# Patient Record
Sex: Female | Born: 1990 | Race: Black or African American | Hispanic: No | Marital: Single | Smoking: Never smoker
Health system: Southern US, Community
[De-identification: ages and names within clinical notes are randomized; demographics above are authoritative.]

## PROBLEM LIST (undated history)

## (undated) ENCOUNTER — Inpatient Hospital Stay (HOSPITAL_COMMUNITY): Payer: Self-pay

## (undated) DIAGNOSIS — R202 Paresthesia of skin: Secondary | ICD-10-CM

## (undated) DIAGNOSIS — E669 Obesity, unspecified: Secondary | ICD-10-CM

## (undated) DIAGNOSIS — L309 Dermatitis, unspecified: Secondary | ICD-10-CM

## (undated) DIAGNOSIS — Z0000 Encounter for general adult medical examination without abnormal findings: Secondary | ICD-10-CM

## (undated) DIAGNOSIS — T7840XA Allergy, unspecified, initial encounter: Secondary | ICD-10-CM

## (undated) DIAGNOSIS — R87612 Low grade squamous intraepithelial lesion on cytologic smear of cervix (LGSIL): Secondary | ICD-10-CM

## (undated) DIAGNOSIS — Z789 Other specified health status: Secondary | ICD-10-CM

## (undated) HISTORY — DX: Paresthesia of skin: R20.2

## (undated) HISTORY — DX: Allergy, unspecified, initial encounter: T78.40XA

## (undated) HISTORY — DX: Low grade squamous intraepithelial lesion on cytologic smear of cervix (LGSIL): R87.612

## (undated) HISTORY — DX: Obesity, unspecified: E66.9

## (undated) HISTORY — DX: Other specified health status: Z78.9

## (undated) HISTORY — DX: Encounter for general adult medical examination without abnormal findings: Z00.00

## (undated) HISTORY — DX: Dermatitis, unspecified: L30.9

---

## 2010-03-16 HISTORY — PX: COLPOSCOPY: SHX161

## 2010-08-31 ENCOUNTER — Inpatient Hospital Stay (INDEPENDENT_AMBULATORY_CARE_PROVIDER_SITE_OTHER)
Admission: RE | Admit: 2010-08-31 | Discharge: 2010-08-31 | Disposition: A | Discharge status: Home / Self Care | Payer: PRIVATE HEALTH INSURANCE | Source: Ambulatory Visit | Attending: Family Medicine | Admitting: Family Medicine

## 2010-08-31 DIAGNOSIS — J039 Acute tonsillitis, unspecified: Secondary | ICD-10-CM

## 2012-01-11 ENCOUNTER — Emergency Department (INDEPENDENT_AMBULATORY_CARE_PROVIDER_SITE_OTHER)
Admission: EM | Admit: 2012-01-11 | Discharge: 2012-01-11 | Disposition: A | Discharge status: Home / Self Care | Payer: BC Managed Care – PPO | Source: Home / Self Care | Attending: Family Medicine | Admitting: Family Medicine

## 2012-01-11 ENCOUNTER — Encounter (HOSPITAL_COMMUNITY): Payer: Self-pay

## 2012-01-11 DIAGNOSIS — S139XXA Sprain of joints and ligaments of unspecified parts of neck, initial encounter: Secondary | ICD-10-CM

## 2012-01-11 DIAGNOSIS — S161XXA Strain of muscle, fascia and tendon at neck level, initial encounter: Secondary | ICD-10-CM

## 2012-01-11 MED ORDER — CYCLOBENZAPRINE HCL 5 MG PO TABS: 5.0000 mg | ORAL_TABLET | Freq: Three times a day (TID) | ORAL | Status: DC | PRN

## 2012-01-11 NOTE — ED Notes (Signed)
Patient states she was getting out of bed this morning and heard a "pop" she started having neck, shoulder and upper back pain on right side

## 2012-01-11 NOTE — ED Provider Notes (Signed)
History     CSN: [id number]  Arrival date & time 01/11/12  1111   First MD Initiated Contact with Patient 01/11/12 1258      Chief Complaint  Patient presents with  . Neck Pain    (Consider location/radiation/quality/duration/timing/severity/associated sxs/prior treatment) Patient is a 21 y.o. female presenting with neck pain. The history is provided by the patient.  Neck Pain  This is a new problem. The current episode started 3 to 5 hours ago. The problem has not changed since onset.The pain is associated with nothing (getting out of bed felt pop). There has been no fever. The quality of the pain is described as shooting. The pain radiates to the right scapula. The pain is mild. The symptoms are aggravated by position. Pertinent negatives include no numbness, no tingling and no weakness. She has tried nothing for the symptoms.    History reviewed. No pertinent past medical history.  History reviewed. No pertinent past surgical history.  No family history on file.  History  Substance Use Topics  . Smoking status: Never Smoker   . Smokeless tobacco: Not on file  . Alcohol Use: Yes    OB History    Grav Para Term Preterm Abortions TAB SAB Ect Mult Living                  Review of Systems  Constitutional: Negative.   HENT: Positive for neck pain. Negative for neck stiffness.   Musculoskeletal: Negative for back pain.  Neurological: Negative.  Negative for tingling, weakness and numbness.    Allergies  Review of patient's allergies indicates no known allergies.  Home Medications   Current Outpatient Rx  Name Route Sig Dispense Refill  . CYCLOBENZAPRINE HCL 5 MG PO TABS Oral Take 1 tablet (5 mg total) by mouth 3 (three) times daily as needed for muscle spasms. 30 tablet 0    BP 127/80  Pulse 68  Temp 98.2 F (36.8 C) (Oral)  Resp 18  SpO2 99%  Physical Exam  Nursing note and vitals reviewed. Constitutional: She is oriented to person, place, and time. She  appears well-developed and well-nourished.  HENT:  Head: Normocephalic.  Neck: Neck supple. Muscular tenderness present. Decreased range of motion present.    Musculoskeletal: She exhibits tenderness.  Lymphadenopathy:    She has no cervical adenopathy.  Neurological: She is alert and oriented to person, place, and time.  Skin: Skin is warm and dry.  Psychiatric: She has a normal mood and affect.    ED Course  Procedures (including critical care time)  Labs Reviewed - No data to display No results found.   1. Strain of neck muscle       MDM          Linna Hoff, MD 01/12/12 [phone]

## 2012-12-05 ENCOUNTER — Emergency Department (INDEPENDENT_AMBULATORY_CARE_PROVIDER_SITE_OTHER)
Admission: EM | Admit: 2012-12-05 | Discharge: 2012-12-05 | Disposition: A | Discharge status: Home / Self Care | Payer: Self-pay | Source: Home / Self Care | Attending: Family Medicine | Admitting: Family Medicine

## 2012-12-05 ENCOUNTER — Encounter (HOSPITAL_COMMUNITY): Payer: Self-pay | Admitting: Emergency Medicine

## 2012-12-05 DIAGNOSIS — J309 Allergic rhinitis, unspecified: Secondary | ICD-10-CM

## 2012-12-05 DIAGNOSIS — J302 Other seasonal allergic rhinitis: Secondary | ICD-10-CM

## 2012-12-05 MED ORDER — FLUTICASONE PROPIONATE 50 MCG/ACT NA SUSP: 2.0000 | Freq: Every day | NASAL | Status: DC

## 2012-12-05 NOTE — ED Notes (Signed)
C/o facial swelling but not sure the cause.  Face does itch.  Eyes are irritated.

## 2012-12-05 NOTE — ED Provider Notes (Signed)
Krista Merritt is a 22 y.o. female who presents to Urgent Care today for left eye puffiness. Patient notes some puffiness and swelling surrounding her left eye as well as itching. She notes some runny nose as well. She denies any eye pain blurry vision fevers chills radiating pain weakness or numbness. She feels well otherwise.    History reviewed. No pertinent past medical history. History  Substance Use Topics  . Smoking status: Never Smoker   . Smokeless tobacco: Not on file  . Alcohol Use: Yes   ROS as above Medications reviewed. No current facility-administered medications for this encounter.   Current Outpatient Prescriptions  Medication Sig Dispense Refill  . fluticasone (FLONASE) 50 MCG/ACT nasal spray Place 2 sprays into the nose daily.  16 g  2    Exam:  BP 134/77  Pulse 92  Temp(Src) 98.4 F (36.9 C) (Oral)  Resp 20  SpO2 100% Gen: Well NAD HEENT: EOMI,  MMM, mild swelling of the upper and lower eyelid on the left. Nontender no significant conjunctival injection.  Lungs: CTABL Nl WOB Heart: RRR no MRG   No results found for this or any previous visit (from the past 24 hour(s)). No results found.  Assessment and Plan: 22 y.o. female with allergies. Doubtful for cellulitis is nontender.  Plan to treat with Flonase nasal spray, Zaditor eyedrops, cetirizine Followup as needed Discussed warning signs or symptoms. Please see discharge instructions. Patient expresses understanding.      Rodolph Bong, MD 12/05/12 [phone]

## 2013-06-13 ENCOUNTER — Encounter: Payer: Self-pay | Admitting: Advanced Practice Midwife

## 2013-06-13 ENCOUNTER — Ambulatory Visit (INDEPENDENT_AMBULATORY_CARE_PROVIDER_SITE_OTHER): Payer: Self-pay | Admitting: Advanced Practice Midwife

## 2013-06-13 VITALS — BP 123/76 | HR 85 | Temp 97.8°F | Ht 66.0 in | Wt 253.0 lb

## 2013-06-13 DIAGNOSIS — Z3046 Encounter for surveillance of implantable subdermal contraceptive: Secondary | ICD-10-CM

## 2013-06-13 DIAGNOSIS — B9689 Other specified bacterial agents as the cause of diseases classified elsewhere: Secondary | ICD-10-CM

## 2013-06-13 DIAGNOSIS — L293 Anogenital pruritus, unspecified: Secondary | ICD-10-CM

## 2013-06-13 DIAGNOSIS — N898 Other specified noninflammatory disorders of vagina: Secondary | ICD-10-CM

## 2013-06-13 DIAGNOSIS — A499 Bacterial infection, unspecified: Secondary | ICD-10-CM

## 2013-06-13 DIAGNOSIS — N760 Acute vaginitis: Secondary | ICD-10-CM

## 2013-06-13 MED ORDER — METRONIDAZOLE 500 MG PO TABS: 500.0000 mg | ORAL_TABLET | Freq: Two times a day (BID) | ORAL | Status: DC

## 2013-06-13 NOTE — Progress Notes (Signed)
Procedure Note Removal of Implanon  Patient had Implanon inserted in 05/2013. Desires removal today.   Reviewed risk and benefits of procedure. Alternative options discussed Patient reported understanding and agreed to continue.   The patient's left arm was palpated and the implant device located. The area was prepped with Betadinex3. The distal end of the device was palpated and 1 cc of 1% lidocaine without epinephrine was injected. A 1.5 mm incision was made. Any fibrotic tissue was carefully dissected away using blunt and/or sharp dissection. The device was removed in an intact manner. Steri-strips and a sterile dressing were applied to the incision.   Followup: The patient tolerated the procedure well without complications. Standard post-procedure care is explained and return precautions are given.  Patient plans no method, Preconception counseling completed.  Krista Merritt CNM     Subjective:    Krista Merritt is a 23 y.o. female who presents for Nexplanon removal and desires screen for BV. Reports abnormal discharge w/ itching. Reports history of BV before and she has similar response.   Patient reports she desires pregnancy, patient does not have a partner.   Contraception: none Menstrual History: OB History   Grav Para Term Preterm Abortions TAB SAB Ect Mult Living   0 0 0 0 0 0 0 0 0 0       Menarche age: 89  No LMP recorded. Patient has had an implant.    The following portions of the patient's history were reviewed and updated as appropriate: allergies, current medications, past family history, past medical history, past social history, past surgical history and problem list.  Review of Systems Pertinent items are noted in HPI.    Objective:    BP 123/76  Pulse 85  Temp(Src) 97.8 F (36.6 C)  Ht 5\' 6"  (1.676 m)  Wt 253 lb (114.76 kg)  BMI 40.85 kg/m2 General:   alert and cooperative  Lymph Nodes:   Cervical, supraclavicular, and axillary nodes normal.   Pelvis:  External genitalia: normal general appearance Vaginal: normal mucosa without prolapse or lesions and discharge, white Copious white discharge, + whiff  Cultures:  bacterial culture     Assessment:     Bacterial Vaginosis Preconception Counseling    Implanon removal   Plan:     Flagyl PO, education completed on BV and prevention Encouraged patient be on daily PNV Implanon removed, see note.  30 min spent with patient greater than 80% spent in counseling and coordination of care.      Lyndle Pang Wilson Merritt CNM

## 2013-06-14 LAB — WET PREP BY MOLECULAR PROBE
Candida species: NEGATIVE
GARDNERELLA VAGINALIS: POSITIVE — AB
TRICHOMONAS VAG: NEGATIVE

## 2013-06-20 ENCOUNTER — Ambulatory Visit: Payer: Self-pay | Admitting: Advanced Practice Midwife

## 2013-06-30 ENCOUNTER — Ambulatory Visit: Payer: Self-pay | Admitting: Advanced Practice Midwife

## 2013-12-20 ENCOUNTER — Encounter (HOSPITAL_COMMUNITY): Payer: Self-pay | Admitting: Emergency Medicine

## 2013-12-20 ENCOUNTER — Emergency Department (INDEPENDENT_AMBULATORY_CARE_PROVIDER_SITE_OTHER)
Admission: EM | Admit: 2013-12-20 | Discharge: 2013-12-20 | Disposition: A | Discharge status: Home / Self Care | Payer: Self-pay | Source: Home / Self Care | Attending: Family Medicine | Admitting: Family Medicine

## 2013-12-20 DIAGNOSIS — L03031 Cellulitis of right toe: Secondary | ICD-10-CM

## 2013-12-20 DIAGNOSIS — B353 Tinea pedis: Secondary | ICD-10-CM

## 2013-12-20 MED ORDER — CEPHALEXIN 500 MG PO CAPS: 500.0000 mg | ORAL_CAPSULE | Freq: Four times a day (QID) | ORAL | Status: DC

## 2013-12-20 MED ORDER — SULFAMETHOXAZOLE-TRIMETHOPRIM 800-160 MG PO TABS: 2.0000 | ORAL_TABLET | Freq: Two times a day (BID) | ORAL | Status: DC

## 2013-12-20 NOTE — Discharge Instructions (Signed)
Thank you for coming in today. Use over-the-counter Lamisil cream on your toes.  Take both antibiotics as directed for one week Go to the emergency room if you get worse   Athlete's Foot Athlete's foot (tinea pedis) is a fungal infection of the skin on the feet. It often occurs on the skin between the toes or underneath the toes. It can also occur on the soles of the feet. Athlete's foot is more likely to occur in hot, humid weather. Not washing your feet or changing your socks often enough can contribute to athlete's foot. The infection can spread from person to person (contagious). CAUSES Athlete's foot is caused by a fungus. This fungus thrives in warm, moist places. Most people get athlete's foot by sharing shower stalls, towels, and wet floors with an infected person. People with weakened immune systems, including those with diabetes, may be more likely to get athlete's foot. SYMPTOMS   Itchy areas between the toes or on the soles of the feet.  White, flaky, or scaly areas between the toes or on the soles of the feet.  Tiny, intensely itchy blisters between the toes or on the soles of the feet.  Tiny cuts on the skin. These cuts can develop a bacterial infection.  Thick or discolored toenails. DIAGNOSIS  Your caregiver can usually tell what the problem is by doing a physical exam. Your caregiver may also take a skin sample from the rash area. The skin sample may be examined under a microscope, or it may be tested to see if fungus will grow in the sample. A sample may also be taken from your toenail for testing. TREATMENT  Over-the-counter and prescription medicines can be used to kill the fungus. These medicines are available as powders or creams. Your caregiver can suggest medicines for you. Fungal infections respond slowly to treatment. You may need to continue using your medicine for several weeks. PREVENTION   Do not share towels.  Wear sandals in wet areas, such as shared  locker rooms and shared showers.  Keep your feet dry. Wear shoes that allow air to circulate. Wear cotton or wool socks. HOME CARE INSTRUCTIONS   Take medicines as directed by your caregiver. Do not use steroid creams on athlete's foot.  Keep your feet clean and cool. Wash your feet daily and dry them thoroughly, especially between your toes.  Change your socks every day. Wear cotton or wool socks. In hot climates, you may need to change your socks 2 to 3 times per day.  Wear sandals or canvas tennis shoes with good air circulation.  If you have blisters, soak your feet in Burow's solution or Epsom salts for 20 to 30 minutes, 2 times a day to dry out the blisters. Make sure you dry your feet thoroughly afterward. SEEK MEDICAL CARE IF:   You have a fever.  You have swelling, soreness, warmth, or redness in your foot.  You are not getting better after 7 days of treatment.  You are not completely cured after 30 days.  You have any problems caused by your medicines. MAKE SURE YOU:   Understand these instructions.  Will watch your condition.  Will get help right away if you are not doing well or get worse. Document Released: 02/28/2000 Document Revised: 05/25/2011 Document Reviewed: 12/19/2010 [facility] Patient Information 2015 [employer] [place]. This information is not intended to replace advice given to you by your health care provider. Make sure you discuss any questions you have with your health care  provider.  Cellulitis Cellulitis is an infection of the skin and the tissue beneath it. The infected area is usually red and tender. Cellulitis occurs most often in the arms and lower legs.  CAUSES  Cellulitis is caused by bacteria that enter the skin through cracks or cuts in the skin. The most common types of bacteria that cause cellulitis are staphylococci and streptococci. SIGNS AND SYMPTOMS   Redness and warmth.  Swelling.  Tenderness or pain.  Fever. DIAGNOSIS  Your  health care provider can usually determine what is wrong based on a physical exam. Blood tests may also be done. TREATMENT  Treatment usually involves taking an antibiotic medicine. HOME CARE INSTRUCTIONS   Take your antibiotic medicine as directed by your health care provider. Finish the antibiotic even if you start to feel better.  Keep the infected arm or leg elevated to reduce swelling.  Apply a warm cloth to the affected area up to 4 times per day to relieve pain.  Take medicines only as directed by your health care provider.  Keep all follow-up visits as directed by your health care provider. SEEK MEDICAL CARE IF:   You notice red streaks coming from the infected area.  Your red area gets larger or turns dark in color.  Your bone or joint underneath the infected area becomes painful after the skin has healed.  Your infection returns in the same area or another area.  You notice a swollen bump in the infected area.  You develop new symptoms.  You have a fever. SEEK IMMEDIATE MEDICAL CARE IF:   You feel very sleepy.  You develop vomiting or diarrhea.  You have a general ill feeling (malaise) with muscle aches and pains. MAKE SURE YOU:   Understand these instructions.  Will watch your condition.  Will get help right away if you are not doing well or get worse. Document Released: 12/10/2004 Document Revised: 07/17/2013 Document Reviewed: 05/18/2011 [facility] Patient Information 2015 [place], [place]. This information is not intended to replace advice given to you by your health care provider. Make sure you discuss any questions you have with your health care provider.

## 2013-12-20 NOTE — ED Notes (Signed)
C/o pain on 2nd digit on right toe Sx include: swelling and pain w/activity Denies inj/trauma Alert, no signs of acute distress.

## 2013-12-20 NOTE — ED Provider Notes (Signed)
Whitnee E StribChancy Milroylin is a 23 y.o. female who presents to Urgent Care today for right second toe pain and swelling. Symptoms started yesterday. Patient denies any injury. No fevers or chills nausea vomiting or diarrhea. Symptoms are mild to moderate.   History reviewed. No pertinent past medical history. History  Substance Use Topics  . Smoking status: Never Smoker   . Smokeless tobacco: Not on file  . Alcohol Use: Yes     Comment: Ocassionally   ROS as above Medications: No current facility-administered medications for this encounter.   Current Outpatient Prescriptions  Medication Sig Dispense Refill  . cephALEXin (KEFLEX) 500 MG capsule Take 1 capsule (500 mg total) by mouth 4 (four) times daily.  28 capsule  0  . metroNIDAZOLE (FLAGYL) 500 MG tablet Take 1 tablet (500 mg total) by mouth 2 (two) times daily.  14 tablet  0  . sulfamethoxazole-trimethoprim (SEPTRA DS) 800-160 MG per tablet Take 2 tablets by mouth 2 (two) times daily.  28 tablet  0    Exam:  BP 130/83  Pulse 79  Temp(Src) 98.2 F (36.8 C) (Oral)  Resp 16  SpO2 100% Gen: Well NAD HEENT: EOMI,  MMM Lungs: Normal work of breathing. CTABL Heart: RRR no MRG Abd: NABS, Soft. Nondistended, Nontender Exts: Brisk capillary refill, warm and well perfused.  Right foot: Second toe mildly swollen mildly tender erythematous. Athlete's foot present interdigital web spaces second and third toe  No results found for this or any previous visit (from the past 24 hour(s)). No results found.  Assessment and Plan: 23 y.o. female with cellulitis of the right second toe due to athlete's foot. Treatment with Keflex and Bactrim. Additionally use topical terbinafine.  Discussed warning signs or symptoms. Please see discharge instructions. Patient expresses understanding.     Rodolph BongEvan S Dyllen Menning, MD 12/20/13 2122

## 2013-12-28 ENCOUNTER — Emergency Department (HOSPITAL_COMMUNITY)
Admission: EM | Admit: 2013-12-28 | Discharge: 2013-12-29 | Disposition: A | Discharge status: Home / Self Care | Payer: Self-pay | Attending: Emergency Medicine | Admitting: Emergency Medicine

## 2013-12-28 ENCOUNTER — Encounter (HOSPITAL_COMMUNITY): Payer: Self-pay | Admitting: Emergency Medicine

## 2013-12-28 DIAGNOSIS — Z792 Long term (current) use of antibiotics: Secondary | ICD-10-CM | POA: Insufficient documentation

## 2013-12-28 DIAGNOSIS — R21 Rash and other nonspecific skin eruption: Secondary | ICD-10-CM | POA: Insufficient documentation

## 2013-12-28 NOTE — ED Notes (Signed)
Pt reports developing small itching "bumps" to entire body x 4 days. Denies pain. Denies fever/chills. Denies trying any medications to relieve. Pt in NAD.

## 2013-12-28 NOTE — ED Provider Notes (Signed)
CSN: [id number]     Arrival date & time 12/28/13  2255 History  This chart was scribed for a non-physician practitioner, Krista GladHeather Maurie Musco, PA-C working with Krista GivensIva L Knapp, MD by Krista Merritt, ED Scribe. [facility] The patient's care was started at 11:54 PM.     Chief Complaint  Patient presents with  . Rash      Patient is a 23 y.o. female presenting with rash. The history is provided by the patient. No language interpreter was used.  Rash Location:  Full body Associated symptoms: no fever   HPI Comments: Krista Merritt is a 23 y.o. female who presents to the Emergency Department complaining of generalized body rash onset 4 days ago. Rash located on the chest, abdomen, back, arms, and legs.  Rash does itch.  Pt has tried putting moisturizer on affected areas without much relief. She denies swelling of lips, tongue, or closing of throat. She further denies any pain to affected areas, fever, or chills. No history of similar occurrences.  No known contacts with similar rash.  No new soaps, detergents, lotions, or medications.     History reviewed. No pertinent past medical history. Past Surgical History  Procedure Laterality Date  . Colposcopy     No family history on file. History  Substance Use Topics  . Smoking status: Never Smoker   . Smokeless tobacco: Not on file  . Alcohol Use: Yes     Comment: Ocassionally   OB History   Grav Para Term Preterm Abortions TAB SAB Ect Mult Living   0 0 0 0 0 0 0 0 0 0      Review of Systems  Constitutional: Negative for fever and chills.  Skin: Positive for rash.      Allergies  Review of patient's allergies indicates no known allergies.  Home Medications   Prior to Admission medications   Medication Sig Start Date End Date Taking? Authorizing Provider  cephALEXin (KEFLEX) 500 MG capsule Take 1 capsule (500 mg total) by mouth 4 (four) times daily. 12/20/13   Krista BongEvan S Corey, MD  metroNIDAZOLE (FLAGYL) 500  MG tablet Take 1 tablet (500 mg total) by mouth 2 (two) times daily. 06/13/13   Krista Merritt, CNM  sulfamethoxazole-trimethoprim (SEPTRA DS) 800-160 MG per tablet Take 2 tablets by mouth 2 (two) times daily. 12/20/13   Krista BongEvan S Corey, MD   BP 135/75  Pulse 102  Temp(Src) 98.5 F (36.9 C) (Oral)  Resp 20  Ht 5\' 5"  (1.651 m)  Wt 230 lb (104.327 kg)  BMI 38.27 kg/m2  SpO2 100%  LMP 12/03/2013 Physical Exam  Nursing note and vitals reviewed. Constitutional: She is oriented to person, place, and time. She appears well-developed and well-nourished. No distress.  HENT:  Head: Normocephalic and atraumatic.  Mouth/Throat: Oropharynx is clear and moist.  Eyes: Conjunctivae and EOM are normal.  Neck: Neck supple. No tracheal deviation present.  Cardiovascular: Normal rate, regular rhythm and normal heart sounds.   Pulmonary/Chest: Effort normal and breath sounds normal. No respiratory distress.  Musculoskeletal: Normal range of motion.  Neurological: She is alert and oriented to person, place, and time.  Skin: Skin is warm and dry. There is erythema.  Diffuse erythematous papular rash on abdomen, chest, back, inner thighs, inner upper arms bilaterally, and anterior neck. No rash in web spaces of hand.   Psychiatric: She has a normal mood and affect. Her behavior is normal.    ED Course  Procedures (including  critical care time) Labs Review Labs Reviewed - No data to display  Results for orders placed in visit on 06/13/13  WET PREP BY MOLECULAR PROBE      Result Value Ref Range   Candida species NEG  Negative   Trichomonas vaginosis NEG  Negative   Gardnerella vaginalis POS (*) Negative   No results found.   Imaging Review No results found.   EKG Interpretation None     Medications - No data to display  11:59 PM- Treatment plan was discussed with patient who verbalizes understanding and agrees.   MDM   Final diagnoses:  None   Patient presents today with a rash.  Rash  not in the distribution of Scabies or Syphilis.  No swelling of the lips, tongue, or throat.  Lungs CTAB.  Feel that the patient is stable for discharge.  Return precautions given.  I personally performed the services described in this documentation, which was scribed in my presence. The recorded information has been reviewed and is accurate.   Krista GladHeather Esa Raden, PA-C 12/29/13 [phone]

## 2013-12-29 MED ORDER — HYDROCORTISONE 2.5 % EX LOTN: TOPICAL_LOTION | Freq: Two times a day (BID) | CUTANEOUS | Status: DC

## 2014-01-05 NOTE — ED Provider Notes (Signed)
Medical screening examination/treatment/procedure(s) were performed by non-physician practitioner and as supervising physician I was immediately available for consultation/collaboration.   EKG Interpretation None      Dene Landsberg, MD, FACEP   Senta Kantor L Margaree Sandhu, MD 01/05/14 1449 

## 2014-10-19 ENCOUNTER — Encounter: Payer: Self-pay | Admitting: *Deleted

## 2014-10-19 ENCOUNTER — Ambulatory Visit: Payer: Self-pay | Admitting: Obstetrics

## 2014-10-19 ENCOUNTER — Telehealth: Payer: Self-pay | Admitting: *Deleted

## 2014-10-19 NOTE — Telephone Encounter (Signed)
Pre-Visit Call completed with patient and chart updated.   Pre-Visit Info documented in Specialty Comments under [employer].    

## 2014-10-22 ENCOUNTER — Ambulatory Visit (INDEPENDENT_AMBULATORY_CARE_PROVIDER_SITE_OTHER): Payer: PRIVATE HEALTH INSURANCE | Admitting: Family Medicine

## 2014-10-22 ENCOUNTER — Encounter: Payer: Self-pay | Admitting: Family Medicine

## 2014-10-22 ENCOUNTER — Encounter: Payer: Self-pay | Admitting: Obstetrics

## 2014-10-22 ENCOUNTER — Ambulatory Visit (INDEPENDENT_AMBULATORY_CARE_PROVIDER_SITE_OTHER): Payer: PRIVATE HEALTH INSURANCE | Admitting: Obstetrics

## 2014-10-22 VITALS — BP 120/82 | HR 94 | Temp 98.2°F | Ht 65.0 in | Wt 257.1 lb

## 2014-10-22 VITALS — BP 110/74 | HR 87 | Temp 97.8°F | Ht 65.0 in | Wt 255.0 lb

## 2014-10-22 DIAGNOSIS — N760 Acute vaginitis: Secondary | ICD-10-CM

## 2014-10-22 DIAGNOSIS — Z01419 Encounter for gynecological examination (general) (routine) without abnormal findings: Secondary | ICD-10-CM

## 2014-10-22 DIAGNOSIS — B9689 Other specified bacterial agents as the cause of diseases classified elsewhere: Secondary | ICD-10-CM

## 2014-10-22 DIAGNOSIS — Z0000 Encounter for general adult medical examination without abnormal findings: Secondary | ICD-10-CM | POA: Diagnosis not present

## 2014-10-22 DIAGNOSIS — A499 Bacterial infection, unspecified: Secondary | ICD-10-CM | POA: Diagnosis not present

## 2014-10-22 DIAGNOSIS — L309 Dermatitis, unspecified: Secondary | ICD-10-CM

## 2014-10-22 DIAGNOSIS — E669 Obesity, unspecified: Secondary | ICD-10-CM

## 2014-10-22 DIAGNOSIS — Z113 Encounter for screening for infections with a predominantly sexual mode of transmission: Secondary | ICD-10-CM | POA: Diagnosis not present

## 2014-10-22 DIAGNOSIS — T7840XA Allergy, unspecified, initial encounter: Secondary | ICD-10-CM

## 2014-10-22 DIAGNOSIS — Z3009 Encounter for other general counseling and advice on contraception: Secondary | ICD-10-CM

## 2014-10-22 DIAGNOSIS — Z124 Encounter for screening for malignant neoplasm of cervix: Secondary | ICD-10-CM | POA: Diagnosis not present

## 2014-10-22 HISTORY — DX: Obesity, unspecified: E66.9

## 2014-10-22 HISTORY — DX: Dermatitis, unspecified: L30.9

## 2014-10-22 LAB — COMPREHENSIVE METABOLIC PANEL
ALK PHOS: 43 U/L (ref 39–117)
ALT: 16 U/L (ref 0–35)
AST: 20 U/L (ref 0–37)
Albumin: 3.9 g/dL (ref 3.5–5.2)
BILIRUBIN TOTAL: 0.4 mg/dL (ref 0.2–1.2)
BUN: 13 mg/dL (ref 6–23)
CHLORIDE: 108 meq/L (ref 96–112)
CO2: 24 meq/L (ref 19–32)
CREATININE: 0.8 mg/dL (ref 0.40–1.20)
Calcium: 9 mg/dL (ref 8.4–10.5)
GFR: 111.96 mL/min (ref >60.00)
Glucose, Bld: 118 mg/dL — ABNORMAL HIGH (ref 70–99)
POTASSIUM: 3.8 meq/L (ref 3.5–5.1)
SODIUM: 140 meq/L (ref 135–145)
Total Protein: 7.2 g/dL (ref 6.0–8.3)

## 2014-10-22 LAB — LIPID PANEL
Cholesterol: 156 mg/dL (ref 0–200)
HDL: 41.1 mg/dL (ref >39.00)
LDL CALC: 94 mg/dL (ref 0–99)
NONHDL: 115.18
TRIGLYCERIDES: 106 mg/dL (ref 0.0–149.0)
Total CHOL/HDL Ratio: 4
VLDL: 21.2 mg/dL (ref 0.0–40.0)

## 2014-10-22 LAB — CBC
HCT: 41.9 % (ref 36.0–46.0)
HEMOGLOBIN: 13.3 g/dL (ref 12.0–15.0)
MCHC: 31.7 g/dL (ref 30.0–36.0)
MCV: 70.4 fl — ABNORMAL LOW (ref 78.0–100.0)
PLATELETS: 344 10*3/uL (ref 150.0–400.0)
RBC: 5.96 Mil/uL — AB (ref 3.87–5.11)
RDW: 15.3 % (ref 11.5–15.5)
WBC: 7 10*3/uL (ref 4.0–10.5)

## 2014-10-22 LAB — RPR

## 2014-10-22 LAB — TSH: TSH: 0.97 u[IU]/mL (ref 0.35–4.50)

## 2014-10-22 MED ORDER — TRIAMCINOLONE ACETONIDE 0.1 % EX CREA: 1.0000 "application " | TOPICAL_CREAM | Freq: Two times a day (BID) | CUTANEOUS | Status: DC | PRN

## 2014-10-22 MED ORDER — METRONIDAZOLE 500 MG PO TABS
500.0000 mg | ORAL_TABLET | Freq: Two times a day (BID) | ORAL | Status: DC
Start: 2014-10-22 — End: 2014-10-22

## 2014-10-22 NOTE — Progress Notes (Signed)
Subjective:        Krista Merritt is a 24 y.o. female here for a routine exam.  Current complaints: none .    Personal health questionnaire:  Is patient Krista Merritt, have a family history of breast and/or ovarian cancer: no Is there a family history of uterine cancer diagnosed at age < 52, gastrointestinal cancer, urinary tract cancer, family member who is a Personnel officer syndrome-associated carrier: no Is the patient overweight and hypertensive, family history of diabetes, personal history of gestational diabetes, preeclampsia or PCOS: no Is patient over 21, have PCOS,  family history of premature CHD under age 35, diabetes, smoke, have hypertension or peripheral artery disease:  no At any time, has a partner hit, kicked or otherwise hurt or frightened you?: no Over the past 2 weeks, have you felt down, depressed or hopeless?: no Over the past 2 weeks, have you felt little interest or pleasure in doing things?:no   Gynecologic History Patient's last menstrual period was 10/07/2014 (exact date). Contraception: condoms Last Pap: 2015. Results were: normal Last mammogram: n/a. Results were: n/a  Obstetric History OB History  Gravida Para Term Preterm AB SAB TAB Ectopic Multiple Living  0 0 0 0 0 0 0 0 0 0         Past Medical History  Diagnosis Date  . Allergy     seasonal    Past Surgical History  Procedure Laterality Date  . Colposcopy       Current outpatient prescriptions:  .  metroNIDAZOLE (FLAGYL) 500 MG tablet, Take 1 tablet (500 mg total) by mouth 2 (two) times daily., Disp: 14 tablet, Rfl: 2 No Known Allergies  History  Substance Use Topics  . Smoking status: Never Smoker   . Smokeless tobacco: Not on file  . Alcohol Use: Yes     Comment: Ocassionally    History reviewed. No pertinent family history.    Review of Systems  Constitutional: negative for fatigue and weight loss.  Positive for obesity Respiratory: negative for cough and  wheezing Cardiovascular: negative for chest pain, fatigue and palpitations Gastrointestinal: negative for abdominal pain and change in bowel habits Musculoskeletal:negative for myalgias Neurological: negative for gait problems and tremors Behavioral/Psych: negative for abusive relationship, depression Endocrine: negative for temperature intolerance   Genitourinary:negative for abnormal menstrual periods, genital lesions, hot flashes, sexual problems.  Positive for malodorous  vaginal discharge Integument/breast: negative for breast lump, breast tenderness, nipple discharge and skin lesion(s)    Objective:       BP 110/74 mmHg  Pulse 87  Temp(Src) 97.8 F (36.6 C)  Ht 5\' 5"  (1.651 m)  Wt 255 lb (115.667 kg)  BMI 42.43 kg/m2  LMP 10/07/2014 (Exact Date) General:   alert  Skin:   no rash or abnormalities  Lungs:   clear to auscultation bilaterally  Heart:   regular rate and rhythm, S1, S2 normal, no murmur, click, rub or gallop  Breasts:   normal without suspicious masses, skin or nipple changes or axillary nodes  Abdomen:  normal findings: no organomegaly, soft, non-tender and no hernia  Pelvis:  External genitalia: normal general appearance Urinary system: urethral meatus normal and bladder without fullness, nontender Vaginal: normal without tenderness, induration or masses Cervix: normal appearance Adnexa: normal bimanual exam Uterus: anteverted and non-tender, normal size   Lab Review Urine pregnancy test Labs reviewed yes Radiologic studies reviewed no    Assessment:    Healthy female exam.    BV  Contraceptive counseling  Obesity  Plan:   Flagyl Rx   Education reviewed: low fat, low cholesterol diet, safe sex/STD prevention, self breast exams and weight bearing exercise. Contraception: condoms. Follow up in: 1 year.   Meds ordered this encounter  Medications  . metroNIDAZOLE (FLAGYL) 500 MG tablet    Sig: Take 1 tablet (500 mg total) by mouth 2 (two)  times daily.    Dispense:  14 tablet    Refill:  2   Orders Placed This Encounter  Procedures  . HIV antibody  . Hepatitis B surface antigen  . RPR  . Hepatitis C antibody

## 2014-10-22 NOTE — Addendum Note (Signed)
Addended by: Marya Landry D on: 10/22/2014 01:52 PM   Modules accepted: Orders

## 2014-10-22 NOTE — Progress Notes (Signed)
Pre visit review using our clinic review tool, if applicable. No additional management support is needed unless otherwise documented below in the visit note. 

## 2014-10-22 NOTE — Patient Instructions (Signed)
Preventive Care for Adults A healthy lifestyle and preventive care can promote health and wellness. Preventive health guidelines for women include the following key practices.  A routine yearly physical is a good way to check with your health care provider about your health and preventive screening. It is a chance to share any concerns and updates on your health and to receive a thorough exam.  Visit your dentist for a routine exam and preventive care every 6 months. Brush your teeth twice a day and floss once a day. Good oral hygiene prevents tooth decay and gum disease.  The frequency of eye exams is based on your age, health, family medical history, use of contact lenses, and other factors. Follow your health care provider's recommendations for frequency of eye exams.  Eat a healthy diet. Foods like vegetables, fruits, whole grains, low-fat dairy products, and lean protein foods contain the nutrients you need without too many calories. Decrease your intake of foods high in solid fats, added sugars, and salt. Eat the right amount of calories for you.Get information about a proper diet from your health care provider, if necessary.  Regular physical exercise is one of the most important things you can do for your health. Most adults should get at least 150 minutes of moderate-intensity exercise (any activity that increases your heart rate and causes you to sweat) each week. In addition, most adults need muscle-strengthening exercises on 2 or more days a week.  Maintain a healthy weight. The body mass index (BMI) is a screening tool to identify possible weight problems. It provides an estimate of body fat based on height and weight. Your health care provider can find your BMI and can help you achieve or maintain a healthy weight.For adults 20 years and older:  A BMI below 18.5 is considered underweight.  A BMI of 18.5 to 24.9 is normal.  A BMI of 25 to 29.9 is considered overweight.  A BMI of  30 and above is considered obese.  Maintain normal blood lipids and cholesterol levels by exercising and minimizing your intake of saturated fat. Eat a balanced diet with plenty of fruit and vegetables. Blood tests for lipids and cholesterol should begin at age 76 and be repeated every 5 years. If your lipid or cholesterol levels are high, you are over 50, or you are at high risk for heart disease, you may need your cholesterol levels checked more frequently.Ongoing high lipid and cholesterol levels should be treated with medicines if diet and exercise are not working.  If you smoke, find out from your health care provider how to quit. If you do not use tobacco, do not start.  Lung cancer screening is recommended for adults aged 22-80 years who are at high risk for developing lung cancer because of a history of smoking. A yearly low-dose CT scan of the lungs is recommended for people who have at least a 30-pack-year history of smoking and are a current smoker or have quit within the past 15 years. A pack year of smoking is smoking an average of 1 pack of cigarettes a day for 1 year (for example: 1 pack a day for 30 years or 2 packs a day for 15 years). Yearly screening should continue until the smoker has stopped smoking for at least 15 years. Yearly screening should be stopped for people who develop a health problem that would prevent them from having lung cancer treatment.  If you are pregnant, do not drink alcohol. If you are breastfeeding,  be very cautious about drinking alcohol. If you are not pregnant and choose to drink alcohol, do not have more than 1 drink per day. One drink is considered to be 12 ounces (355 mL) of beer, 5 ounces (148 mL) of wine, or 1.5 ounces (44 mL) of liquor.  Avoid use of street drugs. Do not share needles with anyone. Ask for help if you need support or instructions about stopping the use of drugs.  High blood pressure causes heart disease and increases the risk of  stroke. Your blood pressure should be checked at least every 1 to 2 years. Ongoing high blood pressure should be treated with medicines if weight loss and exercise do not work.  If you are 75-52 years old, ask your health care provider if you should take aspirin to prevent strokes.  Diabetes screening involves taking a blood sample to check your fasting blood sugar level. This should be done once every 3 years, after age 15, if you are within normal weight and without risk factors for diabetes. Testing should be considered at a younger age or be carried out more frequently if you are overweight and have at least 1 risk factor for diabetes.  Breast cancer screening is essential preventive care for women. You should practice "breast self-awareness." This means understanding the normal appearance and feel of your breasts and may include breast self-examination. Any changes detected, no matter how small, should be reported to a health care provider. Women in their 58s and 30s should have a clinical breast exam (CBE) by a health care provider as part of a regular health exam every 1 to 3 years. After age 16, women should have a CBE every year. Starting at age 53, women should consider having a mammogram (breast X-ray test) every year. Women who have a family history of breast cancer should talk to their health care provider about genetic screening. Women at a high risk of breast cancer should talk to their health care providers about having an MRI and a mammogram every year.  Breast cancer gene (BRCA)-related cancer risk assessment is recommended for women who have family members with BRCA-related cancers. BRCA-related cancers include breast, ovarian, tubal, and peritoneal cancers. Having family members with these cancers may be associated with an increased risk for harmful changes (mutations) in the breast cancer genes BRCA1 and BRCA2. Results of the assessment will determine the need for genetic counseling and  BRCA1 and BRCA2 testing.  Routine pelvic exams to screen for cancer are no longer recommended for nonpregnant women who are considered low risk for cancer of the pelvic organs (ovaries, uterus, and vagina) and who do not have symptoms. Ask your health care provider if a screening pelvic exam is right for you.  If you have had past treatment for cervical cancer or a condition that could lead to cancer, you need Pap tests and screening for cancer for at least 20 years after your treatment. If Pap tests have been discontinued, your risk factors (such as having a new sexual partner) need to be reassessed to determine if screening should be resumed. Some women have medical problems that increase the chance of getting cervical cancer. In these cases, your health care provider may recommend more frequent screening and Pap tests.  The HPV test is an additional test that may be used for cervical cancer screening. The HPV test looks for the virus that can cause the cell changes on the cervix. The cells collected during the Pap test can be  tested for HPV. The HPV test could be used to screen women aged 30 years and older, and should be used in women of any age who have unclear Pap test results. After the age of 30, women should have HPV testing at the same frequency as a Pap test.  Colorectal cancer can be detected and often prevented. Most routine colorectal cancer screening begins at the age of 50 years and continues through age 75 years. However, your health care provider may recommend screening at an earlier age if you have risk factors for colon cancer. On a yearly basis, your health care provider may provide home test kits to check for hidden blood in the stool. Use of a small camera at the end of a tube, to directly examine the colon (sigmoidoscopy or colonoscopy), can detect the earliest forms of colorectal cancer. Talk to your health care provider about this at age 50, when routine screening begins. Direct  exam of the colon should be repeated every 5-10 years through age 75 years, unless early forms of pre-cancerous polyps or small growths are found.  People who are at an increased risk for hepatitis B should be screened for this virus. You are considered at high risk for hepatitis B if:  You were born in a country where hepatitis B occurs often. Talk with your health care provider about which countries are considered high risk.  Your parents were born in a high-risk country and you have not received a shot to protect against hepatitis B (hepatitis B vaccine).  You have HIV or AIDS.  You use needles to inject street drugs.  You live with, or have sex with, someone who has hepatitis B.  You get hemodialysis treatment.  You take certain medicines for conditions like cancer, organ transplantation, and autoimmune conditions.  Hepatitis C blood testing is recommended for all people born from 1945 through 1965 and any individual with known risks for hepatitis C.  Practice safe sex. Use condoms and avoid high-risk sexual practices to reduce the spread of sexually transmitted infections (STIs). STIs include gonorrhea, chlamydia, syphilis, trichomonas, herpes, HPV, and human immunodeficiency virus (HIV). Herpes, HIV, and HPV are viral illnesses that have no cure. They can result in disability, cancer, and death.  You should be screened for sexually transmitted illnesses (STIs) including gonorrhea and chlamydia if:  You are sexually active and are younger than 24 years.  You are older than 24 years and your health care provider tells you that you are at risk for this type of infection.  Your sexual activity has changed since you were last screened and you are at an increased risk for chlamydia or gonorrhea. Ask your health care provider if you are at risk.  If you are at risk of being infected with HIV, it is recommended that you take a prescription medicine daily to prevent HIV infection. This is  called preexposure prophylaxis (PrEP). You are considered at risk if:  You are a heterosexual woman, are sexually active, and are at increased risk for HIV infection.  You take drugs by injection.  You are sexually active with a partner who has HIV.  Talk with your health care provider about whether you are at high risk of being infected with HIV. If you choose to begin PrEP, you should first be tested for HIV. You should then be tested every 3 months for as long as you are taking PrEP.  Osteoporosis is a disease in which the bones lose minerals and strength   with aging. This can result in serious bone fractures or breaks. The risk of osteoporosis can be identified using a bone density scan. Women ages 65 years and over and women at risk for fractures or osteoporosis should discuss screening with their health care providers. Ask your health care provider whether you should take a calcium supplement or vitamin D to reduce the rate of osteoporosis.  Menopause can be associated with physical symptoms and risks. Hormone replacement therapy is available to decrease symptoms and risks. You should talk to your health care provider about whether hormone replacement therapy is right for you.  Use sunscreen. Apply sunscreen liberally and repeatedly throughout the day. You should seek shade when your shadow is shorter than you. Protect yourself by wearing long sleeves, pants, a wide-brimmed hat, and sunglasses year round, whenever you are outdoors.  Once a month, do a whole body skin exam, using a mirror to look at the skin on your back. Tell your health care provider of new moles, moles that have irregular borders, moles that are larger than a pencil eraser, or moles that have changed in shape or color.  Stay current with required vaccines (immunizations).  Influenza vaccine. All adults should be immunized every year.  Tetanus, diphtheria, and acellular pertussis (Td, Tdap) vaccine. Pregnant women should  receive 1 dose of Tdap vaccine during each pregnancy. The dose should be obtained regardless of the length of time since the last dose. Immunization is preferred during the 27th-36th week of gestation. An adult who has not previously received Tdap or who does not know her vaccine status should receive 1 dose of Tdap. This initial dose should be followed by tetanus and diphtheria toxoids (Td) booster doses every 10 years. Adults with an unknown or incomplete history of completing a 3-dose immunization series with Td-containing vaccines should begin or complete a primary immunization series including a Tdap dose. Adults should receive a Td booster every 10 years.  Varicella vaccine. An adult without evidence of immunity to varicella should receive 2 doses or a second dose if she has previously received 1 dose. Pregnant females who do not have evidence of immunity should receive the first dose after pregnancy. This first dose should be obtained before leaving the health care facility. The second dose should be obtained 4-8 weeks after the first dose.  Human papillomavirus (HPV) vaccine. Females aged 13-26 years who have not received the vaccine previously should obtain the 3-dose series. The vaccine is not recommended for use in pregnant females. However, pregnancy testing is not needed before receiving a dose. If a female is found to be pregnant after receiving a dose, no treatment is needed. In that case, the remaining doses should be delayed until after the pregnancy. Immunization is recommended for any person with an immunocompromised condition through the age of 26 years if she did not get any or all doses earlier. During the 3-dose series, the second dose should be obtained 4-8 weeks after the first dose. The third dose should be obtained 24 weeks after the first dose and 16 weeks after the second dose.  Zoster vaccine. One dose is recommended for adults aged 60 years or older unless certain conditions are  present.  Measles, mumps, and rubella (MMR) vaccine. Adults born before 1957 generally are considered immune to measles and mumps. Adults born in 1957 or later should have 1 or more doses of MMR vaccine unless there is a contraindication to the vaccine or there is laboratory evidence of immunity to   each of the three diseases. A routine second dose of MMR vaccine should be obtained at least 28 days after the first dose for students attending postsecondary schools, health care workers, or international travelers. People who received inactivated measles vaccine or an unknown type of measles vaccine during 1963-1967 should receive 2 doses of MMR vaccine. People who received inactivated mumps vaccine or an unknown type of mumps vaccine before 1979 and are at high risk for mumps infection should consider immunization with 2 doses of MMR vaccine. For females of childbearing age, rubella immunity should be determined. If there is no evidence of immunity, females who are not pregnant should be vaccinated. If there is no evidence of immunity, females who are pregnant should delay immunization until after pregnancy. Unvaccinated health care workers born before 1957 who lack laboratory evidence of measles, mumps, or rubella immunity or laboratory confirmation of disease should consider measles and mumps immunization with 2 doses of MMR vaccine or rubella immunization with 1 dose of MMR vaccine.  Pneumococcal 13-valent conjugate (PCV13) vaccine. When indicated, a person who is uncertain of her immunization history and has no record of immunization should receive the PCV13 vaccine. An adult aged 19 years or older who has certain medical conditions and has not been previously immunized should receive 1 dose of PCV13 vaccine. This PCV13 should be followed with a dose of pneumococcal polysaccharide (PPSV23) vaccine. The PPSV23 vaccine dose should be obtained at least 8 weeks after the dose of PCV13 vaccine. An adult aged 19  years or older who has certain medical conditions and previously received 1 or more doses of PPSV23 vaccine should receive 1 dose of PCV13. The PCV13 vaccine dose should be obtained 1 or more years after the last PPSV23 vaccine dose.  Pneumococcal polysaccharide (PPSV23) vaccine. When PCV13 is also indicated, PCV13 should be obtained first. All adults aged 65 years and older should be immunized. An adult younger than age 65 years who has certain medical conditions should be immunized. Any person who resides in a nursing home or long-term care facility should be immunized. An adult smoker should be immunized. People with an immunocompromised condition and certain other conditions should receive both PCV13 and PPSV23 vaccines. People with human immunodeficiency virus (HIV) infection should be immunized as soon as possible after diagnosis. Immunization during chemotherapy or radiation therapy should be avoided. Routine use of PPSV23 vaccine is not recommended for American Indians, [place] Natives, or people younger than 65 years unless there are medical conditions that require PPSV23 vaccine. When indicated, people who have unknown immunization and have no record of immunization should receive PPSV23 vaccine. One-time revaccination 5 years after the first dose of PPSV23 is recommended for people aged 19-64 years who have chronic kidney failure, nephrotic syndrome, asplenia, or immunocompromised conditions. People who received 1-2 doses of PPSV23 before age 65 years should receive another dose of PPSV23 vaccine at age 65 years or later if at least 5 years have passed since the previous dose. Doses of PPSV23 are not needed for people immunized with PPSV23 at or after age 65 years.  Meningococcal vaccine. Adults with asplenia or persistent complement component deficiencies should receive 2 doses of quadrivalent meningococcal conjugate (MenACWY-D) vaccine. The doses should be obtained at least 2 months apart.  Microbiologists working with certain meningococcal bacteria, military recruits, people at risk during an outbreak, and people who travel to or live in countries with a high rate of meningitis should be immunized. A first-year college student up through age   21 years who is living in a residence hall should receive a dose if she did not receive a dose on or after her 16th birthday. Adults who have certain high-risk conditions should receive one or more doses of vaccine.  Hepatitis A vaccine. Adults who wish to be protected from this disease, have certain high-risk conditions, work with hepatitis A-infected animals, work in hepatitis A research labs, or travel to or work in countries with a high rate of hepatitis A should be immunized. Adults who were previously unvaccinated and who anticipate close contact with an international adoptee during the first 60 days after arrival in the [place] from a country with a high rate of hepatitis A should be immunized.  Hepatitis B vaccine. Adults who wish to be protected from this disease, have certain high-risk conditions, may be exposed to blood or other infectious body fluids, are household contacts or sex partners of hepatitis B positive people, are clients or workers in certain care facilities, or travel to or work in countries with a high rate of hepatitis B should be immunized.  Haemophilus influenzae type b (Hib) vaccine. A previously unvaccinated person with asplenia or sickle cell disease or having a scheduled splenectomy should receive 1 dose of Hib vaccine. Regardless of previous immunization, a recipient of a hematopoietic stem cell transplant should receive a 3-dose series 6-12 months after her successful transplant. Hib vaccine is not recommended for adults with HIV infection. Preventive Services / Frequency Ages 64 to 68 years  Blood pressure check.** / Every 1 to 2 years.  Lipid and cholesterol check.** / Every 5 years beginning at age  22.  Clinical breast exam.** / Every 3 years for women in their 88s and 53s.  BRCA-related cancer risk assessment.** / For women who have family members with a BRCA-related cancer (breast, ovarian, tubal, or peritoneal cancers).  Pap test.** / Every 2 years from ages 90 through 51. Every 3 years starting at age 21 through age 56 or 3 with a history of 3 consecutive normal Pap tests.  HPV screening.** / Every 3 years from ages 24 through ages 1 to 46 with a history of 3 consecutive normal Pap tests.  Hepatitis C blood test.** / For any individual with known risks for hepatitis C.  Skin self-exam. / Monthly.  Influenza vaccine. / Every year.  Tetanus, diphtheria, and acellular pertussis (Tdap, Td) vaccine.** / Consult your health care provider. Pregnant women should receive 1 dose of Tdap vaccine during each pregnancy. 1 dose of Td every 10 years.  Varicella vaccine.** / Consult your health care provider. Pregnant females who do not have evidence of immunity should receive the first dose after pregnancy.  HPV vaccine. / 3 doses over 6 months, if 72 and younger. The vaccine is not recommended for use in pregnant females. However, pregnancy testing is not needed before receiving a dose.  Measles, mumps, rubella (MMR) vaccine.** / You need at least 1 dose of MMR if you were born in 1957 or later. You may also need a 2nd dose. For females of childbearing age, rubella immunity should be determined. If there is no evidence of immunity, females who are not pregnant should be vaccinated. If there is no evidence of immunity, females who are pregnant should delay immunization until after pregnancy.  Pneumococcal 13-valent conjugate (PCV13) vaccine.** / Consult your health care provider.  Pneumococcal polysaccharide (PPSV23) vaccine.** / 1 to 2 doses if you smoke cigarettes or if you have certain conditions.  Meningococcal vaccine.** /  1 dose if you are age 19 to 21 years and a first-year college  student living in a residence hall, or have one of several medical conditions, you need to get vaccinated against meningococcal disease. You may also need additional booster doses.  Hepatitis A vaccine.** / Consult your health care provider.  Hepatitis B vaccine.** / Consult your health care provider.  Haemophilus influenzae type b (Hib) vaccine.** / Consult your health care provider. Ages 40 to 64 years  Blood pressure check.** / Every 1 to 2 years.  Lipid and cholesterol check.** / Every 5 years beginning at age 20 years.  Lung cancer screening. / Every year if you are aged 55-80 years and have a 30-pack-year history of smoking and currently smoke or have quit within the past 15 years. Yearly screening is stopped once you have quit smoking for at least 15 years or develop a health problem that would prevent you from having lung cancer treatment.  Clinical breast exam.** / Every year after age 40 years.  BRCA-related cancer risk assessment.** / For women who have family members with a BRCA-related cancer (breast, ovarian, tubal, or peritoneal cancers).  Mammogram.** / Every year beginning at age 40 years and continuing for as long as you are in good health. Consult with your health care provider.  Pap test.** / Every 3 years starting at age 30 years through age 65 or 70 years with a history of 3 consecutive normal Pap tests.  HPV screening.** / Every 3 years from ages 30 years through ages 65 to 70 years with a history of 3 consecutive normal Pap tests.  Fecal occult blood test (FOBT) of stool. / Every year beginning at age 50 years and continuing until age 75 years. You may not need to do this test if you get a colonoscopy every 10 years.  Flexible sigmoidoscopy or colonoscopy.** / Every 5 years for a flexible sigmoidoscopy or every 10 years for a colonoscopy beginning at age 50 years and continuing until age 75 years.  Hepatitis C blood test.** / For all people born from 1945 through  1965 and any individual with known risks for hepatitis C.  Skin self-exam. / Monthly.  Influenza vaccine. / Every year.  Tetanus, diphtheria, and acellular pertussis (Tdap/Td) vaccine.** / Consult your health care provider. Pregnant women should receive 1 dose of Tdap vaccine during each pregnancy. 1 dose of Td every 10 years.  Varicella vaccine.** / Consult your health care provider. Pregnant females who do not have evidence of immunity should receive the first dose after pregnancy.  Zoster vaccine.** / 1 dose for adults aged 60 years or older.  Measles, mumps, rubella (MMR) vaccine.** / You need at least 1 dose of MMR if you were born in 1957 or later. You may also need a 2nd dose. For females of childbearing age, rubella immunity should be determined. If there is no evidence of immunity, females who are not pregnant should be vaccinated. If there is no evidence of immunity, females who are pregnant should delay immunization until after pregnancy.  Pneumococcal 13-valent conjugate (PCV13) vaccine.** / Consult your health care provider.  Pneumococcal polysaccharide (PPSV23) vaccine.** / 1 to 2 doses if you smoke cigarettes or if you have certain conditions.  Meningococcal vaccine.** / Consult your health care provider.  Hepatitis A vaccine.** / Consult your health care provider.  Hepatitis B vaccine.** / Consult your health care provider.  Haemophilus influenzae type b (Hib) vaccine.** / Consult your health care provider. Ages 65   years and over  Blood pressure check.** / Every 1 to 2 years.  Lipid and cholesterol check.** / Every 5 years beginning at age 58 years.  Lung cancer screening. / Every year if you are aged 8-80 years and have a 30-pack-year history of smoking and currently smoke or have quit within the past 15 years. Yearly screening is stopped once you have quit smoking for at least 15 years or develop a health problem that would prevent you from having lung cancer  treatment.  Clinical breast exam.** / Every year after age 77 years.  BRCA-related cancer risk assessment.** / For women who have family members with a BRCA-related cancer (breast, ovarian, tubal, or peritoneal cancers).  Mammogram.** / Every year beginning at age 40 years and continuing for as long as you are in good health. Consult with your health care provider.  Pap test.** / Every 3 years starting at age 33 years through age 67 or 96 years with 3 consecutive normal Pap tests. Testing can be stopped between 65 and 70 years with 3 consecutive normal Pap tests and no abnormal Pap or HPV tests in the past 10 years.  HPV screening.** / Every 3 years from ages 41 years through ages 6 or 77 years with a history of 3 consecutive normal Pap tests. Testing can be stopped between 65 and 70 years with 3 consecutive normal Pap tests and no abnormal Pap or HPV tests in the past 10 years.  Fecal occult blood test (FOBT) of stool. / Every year beginning at age 43 years and continuing until age 60 years. You may not need to do this test if you get a colonoscopy every 10 years.  Flexible sigmoidoscopy or colonoscopy.** / Every 5 years for a flexible sigmoidoscopy or every 10 years for a colonoscopy beginning at age 34 years and continuing until age 78 years.  Hepatitis C blood test.** / For all people born from [birth year] through 1966 and any individual with known risks for hepatitis C.  Osteoporosis screening.** / A one-time screening for women ages 96 years and over and women at risk for fractures or osteoporosis.  Skin self-exam. / Monthly.  Influenza vaccine. / Every year.  Tetanus, diphtheria, and acellular pertussis (Tdap/Td) vaccine.** / 1 dose of Td every 10 years.  Varicella vaccine.** / Consult your health care provider.  Zoster vaccine.** / 1 dose for adults aged 49 years or older.  Pneumococcal 13-valent conjugate (PCV13) vaccine.** / Consult your health care provider.  Pneumococcal  polysaccharide (PPSV23) vaccine.** / 1 dose for all adults aged 32 years and older.  Meningococcal vaccine.** / Consult your health care provider.  Hepatitis A vaccine.** / Consult your health care provider.  Hepatitis B vaccine.** / Consult your health care provider.  Haemophilus influenzae type b (Hib) vaccine.** / Consult your health care provider. ** Family history and personal history of risk and conditions may change your health care provider's recommendations. Document Released: 04/28/2001 Document Revised: 07/17/2013 Document Reviewed: 07/28/2010 [facility] Patient Information 2015 [place], [place]. This information is not intended to replace advice given to you by your health care provider. Make sure you discuss any questions you have with your health care provider. DASH Eating Plan DASH stands for "Dietary Approaches to Stop Hypertension." The DASH eating plan is a healthy eating plan that has been shown to reduce high blood pressure (hypertension). Additional health benefits may include reducing the risk of type 2 diabetes mellitus, heart disease, and stroke. The DASH eating plan may also help  with weight loss. WHAT DO I NEED TO KNOW ABOUT THE DASH EATING PLAN? For the DASH eating plan, you will follow these general guidelines:  Choose foods with a percent daily value for sodium of less than 5% (as listed on the food label).  Use salt-free seasonings or herbs instead of table salt or sea salt.  Check with your health care provider or pharmacist before using salt substitutes.  Eat lower-sodium products, often labeled as "lower sodium" or "no salt added."  Eat fresh foods.  Eat more vegetables, fruits, and low-fat dairy products.  Choose whole grains. Look for the word "whole" as the first word in the ingredient list.  Choose fish and skinless chicken or turkey more often than red meat. Limit fish, poultry, and meat to 6 oz (170 g) each day.  Limit sweets, desserts, sugars, and  sugary drinks.  Choose heart-healthy fats.  Limit cheese to 1 oz (28 g) per day.  Eat more home-cooked food and less restaurant, buffet, and fast food.  Limit fried foods.  Cook foods using methods other than frying.  Limit canned vegetables. If you do use them, rinse them well to decrease the sodium.  When eating at a restaurant, ask that your food be prepared with less salt, or no salt if possible. WHAT FOODS CAN I EAT? Seek help from a dietitian for individual calorie needs. Grains Whole grain or whole wheat bread. Brown rice. Whole grain or whole wheat pasta. Quinoa, bulgur, and whole grain cereals. Low-sodium cereals. Corn or whole wheat flour tortillas. Whole grain cornbread. Whole grain crackers. Low-sodium crackers. Vegetables Fresh or frozen vegetables (raw, steamed, roasted, or grilled). Low-sodium or reduced-sodium tomato and vegetable juices. Low-sodium or reduced-sodium tomato sauce and paste. Low-sodium or reduced-sodium canned vegetables.  Fruits All fresh, canned (in natural juice), or frozen fruits. Meat and Other Protein Products Ground beef (85% or leaner), grass-fed beef, or beef trimmed of fat. Skinless chicken or turkey. Ground chicken or turkey. Pork trimmed of fat. All fish and seafood. Eggs. Dried beans, peas, or lentils. Unsalted nuts and seeds. Unsalted canned beans. Dairy Low-fat dairy products, such as skim or 1% milk, 2% or reduced-fat cheeses, low-fat ricotta or cottage cheese, or plain low-fat yogurt. Low-sodium or reduced-sodium cheeses. Fats and Oils Tub margarines without trans fats. Light or reduced-fat mayonnaise and salad dressings (reduced sodium). Avocado. Safflower, olive, or canola oils. Natural peanut or almond butter. Other Unsalted popcorn and pretzels. The items listed above may not be a complete list of recommended foods or beverages. Contact your dietitian for more options. WHAT FOODS ARE NOT RECOMMENDED? Grains White bread. White  pasta. White rice. Refined cornbread. Bagels and croissants. Crackers that contain trans fat. Vegetables Creamed or fried vegetables. Vegetables in a cheese sauce. Regular canned vegetables. Regular canned tomato sauce and paste. Regular tomato and vegetable juices. Fruits Dried fruits. Canned fruit in light or heavy syrup. Fruit juice. Meat and Other Protein Products Fatty cuts of meat. Ribs, chicken wings, bacon, sausage, bologna, salami, chitterlings, fatback, hot dogs, bratwurst, and packaged luncheon meats. Salted nuts and seeds. Canned beans with salt. Dairy Whole or 2% milk, cream, half-and-half, and cream cheese. Whole-fat or sweetened yogurt. Full-fat cheeses or blue cheese. Nondairy creamers and whipped toppings. Processed cheese, cheese spreads, or cheese curds. Condiments Onion and garlic salt, seasoned salt, table salt, and sea salt. Canned and packaged gravies. Worcestershire sauce. Tartar sauce. Barbecue sauce. Teriyaki sauce. Soy sauce, including reduced sodium. Steak sauce. Fish sauce. Oyster sauce. Cocktail sauce.   Horseradish. Ketchup and mustard. Meat flavorings and tenderizers. Bouillon cubes. Hot sauce. Tabasco sauce. Marinades. Taco seasonings. Relishes. Fats and Oils Butter, stick margarine, lard, shortening, ghee, and bacon fat. Coconut, palm kernel, or palm oils. Regular salad dressings. Other Pickles and olives. Salted popcorn and pretzels. The items listed above may not be a complete list of foods and beverages to avoid. Contact your dietitian for more information. WHERE CAN I FIND MORE INFORMATION? National Heart, Lung, and Blood Institute: travelstabloid.com Document Released: 02/19/2011 Document Revised: 07/17/2013 Document Reviewed: 01/04/2013 [facility] Patient Information 2015 [place], [place]. This information is not intended to replace advice given to you by your health care provider. Make sure you discuss any questions you have with  your health care provider.

## 2014-10-23 ENCOUNTER — Other Ambulatory Visit (INDEPENDENT_AMBULATORY_CARE_PROVIDER_SITE_OTHER): Payer: PRIVATE HEALTH INSURANCE

## 2014-10-23 ENCOUNTER — Telehealth: Payer: Self-pay | Admitting: *Deleted

## 2014-10-23 DIAGNOSIS — R739 Hyperglycemia, unspecified: Secondary | ICD-10-CM | POA: Diagnosis not present

## 2014-10-23 DIAGNOSIS — R7303 Prediabetes: Secondary | ICD-10-CM

## 2014-10-23 LAB — HEPATITIS B SURFACE ANTIGEN: HEP B S AG: NEGATIVE

## 2014-10-23 LAB — PAP IG W/ RFLX HPV ASCU

## 2014-10-23 LAB — HIV ANTIBODY (ROUTINE TESTING W REFLEX): HIV 1&2 Ab, 4th Generation: NONREACTIVE

## 2014-10-23 LAB — HEMOGLOBIN A1C: HEMOGLOBIN A1C: 5.8 % (ref 4.6–6.5)

## 2014-10-23 LAB — HEPATITIS C ANTIBODY: HCV Ab: NEGATIVE

## 2014-10-23 NOTE — Telephone Encounter (Signed)
Notified pt and she voices understanding.  Lab appt scheduled for 04/18/15 at 7am.  Future lab orders entered.

## 2014-10-23 NOTE — Telephone Encounter (Signed)
-----   Message from Bradd Canary, MD sent at 10/23/2014 12:41 PM EDT ----- Notify sugar very slightly above normal (5.7 or below is normal, not diabetes til over 6.5). She needs to consider herself pre diabetic, so she needs to keep her simple carbohydrates down and we should see her in 3-6 months and repeat a hgba1c and cmp prior to the visit so we can discuss the trend.

## 2014-10-25 ENCOUNTER — Other Ambulatory Visit: Payer: Self-pay | Admitting: Obstetrics

## 2014-10-25 DIAGNOSIS — N760 Acute vaginitis: Principal | ICD-10-CM

## 2014-10-25 DIAGNOSIS — B9689 Other specified bacterial agents as the cause of diseases classified elsewhere: Secondary | ICD-10-CM

## 2014-10-25 LAB — SURESWAB, VAGINOSIS/VAGINITIS PLUS
Atopobium vaginae: 5.9 Log (cells/mL)
BV CATEGORY: UNDETERMINED — AB
C. ALBICANS, DNA: NOT DETECTED
C. GLABRATA, DNA: NOT DETECTED
C. TROPICALIS, DNA: NOT DETECTED
C. parapsilosis, DNA: NOT DETECTED
C. trachomatis RNA, TMA: NOT DETECTED
LACTOBACILLUS SPECIES: 5.3 Log (cells/mL)
MEGASPHAERA SPECIES: 6.7 Log (cells/mL)
N. gonorrhoeae RNA, TMA: NOT DETECTED
T. VAGINALIS RNA, QL TMA: NOT DETECTED

## 2014-10-25 MED ORDER — TINIDAZOLE 500 MG PO TABS: 1000.0000 mg | ORAL_TABLET | Freq: Every day | ORAL | Status: DC

## 2014-11-04 ENCOUNTER — Encounter: Payer: Self-pay | Admitting: Family Medicine

## 2014-11-04 DIAGNOSIS — Z0000 Encounter for general adult medical examination without abnormal findings: Secondary | ICD-10-CM

## 2014-11-04 DIAGNOSIS — T7840XA Allergy, unspecified, initial encounter: Secondary | ICD-10-CM | POA: Insufficient documentation

## 2014-11-04 HISTORY — DX: Encounter for general adult medical examination without abnormal findings: Z00.00

## 2014-11-04 NOTE — Assessment & Plan Note (Signed)
Behind knees, given rx for Triamcinolone

## 2014-11-04 NOTE — Progress Notes (Signed)
Subjective:    Patient ID: Krista Merritt, female    DOB: February 25, 1991, 24 y.o.   MRN: [id number]  Chief Complaint  Patient presents with  . Establish Care    HPI Patient is in today for new patient appt. Has a PMH significant for recurrent BV, eczema, obesity. Is having a flare in an itchy rash behind her knees. This only began in her adult years. No other recent illness. Does note some persistent vaginal discharge but does follow with OB/GYN. No recent acute illness or fevers. Denies CP/palp/SOB/HA/congestion/fevers/GI or GU c/o. Taking meds as prescribed  Past Medical History  Diagnosis Date  . Eczema 10/22/2014  . Obesity 10/22/2014  . Allergy     seasonal  . Preventative health care 11/04/2014    Past Surgical History  Procedure Laterality Date  . Colposcopy  2012    abnormal cells, cervix, roughly 2012, normal    Family History  Problem Relation Age of Onset  . Mental illness Father     suicide  . Diabetes Maternal Grandmother   . Asthma Maternal Grandmother   . COPD Paternal Grandmother     recurrent bronchitis  . Arthritis Paternal Grandmother     Social History   Social History  . Marital Status: Single    Spouse Name: N/A  . Number of Children: N/A  . Years of Education: N/A   Occupational History  . teacher pre K    Social History Main Topics  . Smoking status: Never Smoker   . Smokeless tobacco: Not on file  . Alcohol Use: Yes     Comment: Ocassionally  . Drug Use: No  . Sexual Activity:    Partners: Male    Birth Control/ Protection: None     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, preschool teacher   Other Topics Concern  . Not on file   Social History Narrative    Outpatient Prescriptions Prior to Visit  Medication Sig Dispense Refill  . metroNIDAZOLE (FLAGYL) 500 MG tablet Take 1 tablet (500 mg total) by mouth 2 (two) times daily. 14 tablet 2   No facility-administered medications prior to visit.    No Known  Allergies  Review of Systems  Constitutional: Negative for fever, chills and malaise/fatigue.  HENT: Negative for congestion and hearing loss.   Eyes: Negative for discharge.  Respiratory: Negative for cough, sputum production and shortness of breath.   Cardiovascular: Negative for chest pain, palpitations and leg swelling.  Gastrointestinal: Negative for heartburn, nausea, vomiting, abdominal pain, diarrhea, constipation and blood in stool.  Genitourinary: Negative for dysuria, urgency, frequency and hematuria.  Musculoskeletal: Negative for myalgias, back pain and falls.  Skin: Positive for itching and rash.  Neurological: Negative for dizziness, sensory change, loss of consciousness, weakness and headaches.  Endo/Heme/Allergies: Negative for environmental allergies. Does not bruise/bleed easily.  Psychiatric/Behavioral: Negative for depression and suicidal ideas. The patient is not nervous/anxious and does not have insomnia.        Objective:    Physical Exam  Constitutional: She is oriented to person, place, and time. She appears well-developed and well-nourished. No distress.  HENT:  Head: Normocephalic and atraumatic.  Eyes: Conjunctivae are normal.  Neck: Neck supple. No thyromegaly present.  Cardiovascular: Normal rate, regular rhythm and normal heart sounds.   No murmur heard. Pulmonary/Chest: Effort normal and breath sounds normal. No respiratory distress.  Abdominal: Soft. Bowel sounds are normal. She exhibits no distension and no mass. There is no tenderness.  Musculoskeletal:  She exhibits no edema.  Lymphadenopathy:    She has no cervical adenopathy.  Neurological: She is alert and oriented to person, place, and time.  Skin: Skin is warm and dry.  Psychiatric: She has a normal mood and affect. Her behavior is normal.    BP 120/82 mmHg  Pulse 94  Temp(Src) 98.2 F (36.8 C) (Oral)  Ht  (1.651 m)  Wt 257 lb 2 oz (116.631 kg)  BMI 42.79 kg/m2  SpO2 99%  LMP  10/07/2014 (Exact Date) Wt Readings from Last 3 Encounters:  10/22/14 257 lb 2 oz (116.631 kg)  10/22/14 255 lb (115.667 kg)  12/28/13 230 lb (104.327 kg)     Lab Results  Component Value Date   WBC 7.0 10/22/2014   HGB 13.3 10/22/2014   HCT 41.9 10/22/2014   PLT 344.0 10/22/2014   GLUCOSE 118* 10/22/2014   CHOL 156 10/22/2014   TRIG 106.0 10/22/2014   HDL 41.10 10/22/2014   LDLCALC 94 10/22/2014   ALT 16 10/22/2014   AST 20 10/22/2014   NA 140 10/22/2014   K 3.8 10/22/2014   CL 108 10/22/2014   CREATININE 0.80 10/22/2014   BUN 13 10/22/2014   CO2 24 10/22/2014   TSH 0.97 10/22/2014   HGBA1C 5.8 10/23/2014    Lab Results  Component Value Date   TSH 0.97 10/22/2014   Lab Results  Component Value Date   WBC 7.0 10/22/2014   HGB 13.3 10/22/2014   HCT 41.9 10/22/2014   MCV 70.4* 10/22/2014   PLT 344.0 10/22/2014   Lab Results  Component Value Date   NA 140 10/22/2014   K 3.8 10/22/2014   CO2 24 10/22/2014   GLUCOSE 118* 10/22/2014   BUN 13 10/22/2014   CREATININE 0.80 10/22/2014   BILITOT 0.4 10/22/2014   ALKPHOS 43 10/22/2014   AST 20 10/22/2014   ALT 16 10/22/2014   PROT 7.2 10/22/2014   ALBUMIN 3.9 10/22/2014   CALCIUM 9.0 10/22/2014   GFR 111.96 10/22/2014   Lab Results  Component Value Date   CHOL 156 10/22/2014   Lab Results  Component Value Date   HDL 41.10 10/22/2014   Lab Results  Component Value Date   LDLCALC 94 10/22/2014   Lab Results  Component Value Date   TRIG 106.0 10/22/2014   Lab Results  Component Value Date   CHOLHDL 4 10/22/2014   Lab Results  Component Value Date   HGBA1C 5.8 10/23/2014       Assessment & Plan:   Problem List Items Addressed This Visit    Preventative health care    Patient encouraged to maintain heart healthy diet, regular exercise, adequate sleep. Consider daily probiotics. Take medications as prescribed. Labs ordered and reviewed. Hyperglycemia, nonfasting noted. hgba1c acceptable,  minimize simple carbs. Increase exercise as tolerated. Given and reviewed copy of ACP documents from [employer] and encouraged to complete and return      Relevant Orders   TSH (Completed)   CBC (Completed)   Comprehensive metabolic panel (Completed)   Lipid panel (Completed)   Obesity    Encouraged DASH diet, decrease po intake and increase exercise as tolerated. Needs 7-8 hours of sleep nightly. Avoid trans fats, eat small, frequent meals every 4-5 hours with lean proteins, complex carbs and healthy fats. Minimize simple carbs, GMO foods.      Relevant Orders   TSH (Completed)   CBC (Completed)   Comprehensive metabolic panel (Completed)   Lipid panel (Completed)  Eczema - Primary    Behind knees, given rx for Triamcinolone      Relevant Medications   triamcinolone cream (KENALOG) 0.1 %   Other Relevant Orders   TSH (Completed)   CBC (Completed)   Comprehensive metabolic panel (Completed)   Lipid panel (Completed)   BV (bacterial vaginosis)    Encouraged probiotics, avoid harsh soaps, cotton undergarments. Follow up as needed      Allergy    Encouraged Zyrtec and flonase during flares.         I have discontinued Ms. Reichl's metroNIDAZOLE. I am also having her start on triamcinolone cream.  Meds ordered this encounter  Medications  . triamcinolone cream (KENALOG) 0.1 %    Sig: Apply 1 application topically 2 (two) times daily as needed.    Dispense:  80 g    Refill:  1     Danise Edge, MD

## 2014-11-04 NOTE — Assessment & Plan Note (Signed)
Encouraged DASH diet, decrease po intake and increase exercise as tolerated. Needs 7-8 hours of sleep nightly. Avoid trans fats, eat small, frequent meals every 4-5 hours with lean proteins, complex carbs and healthy fats. Minimize simple carbs, GMO foods. 

## 2014-11-04 NOTE — Assessment & Plan Note (Signed)
Patient encouraged to maintain heart healthy diet, regular exercise, adequate sleep. Consider daily probiotics. Take medications as prescribed. Labs ordered and reviewed. Hyperglycemia, nonfasting noted. hgba1c acceptable, minimize simple carbs. Increase exercise as tolerated. Given and reviewed copy of ACP documents from [employer] and encouraged to complete and return

## 2014-11-04 NOTE — Assessment & Plan Note (Signed)
Encouraged probiotics, avoid harsh soaps, cotton undergarments. Follow up as needed

## 2014-11-04 NOTE — Assessment & Plan Note (Signed)
Encouraged Zyrtec and flonase during flares.

## 2014-12-10 ENCOUNTER — Encounter: Payer: Self-pay | Admitting: Obstetrics

## 2014-12-10 ENCOUNTER — Telehealth: Payer: Self-pay | Admitting: Obstetrics

## 2014-12-17 NOTE — Telephone Encounter (Signed)
[id number] - Left patient a vm to please call and reschedule missed consult. brm

## 2014-12-20 ENCOUNTER — Telehealth: Payer: Self-pay | Admitting: *Deleted

## 2014-12-20 NOTE — Telephone Encounter (Signed)
Patient contacted the office to reschedule her appointment for Colposcopy consult.  Attempted to contact the patient and unable to leave message due to mailbox being full.

## 2014-12-20 NOTE — Telephone Encounter (Signed)
Close encounter 

## 2015-01-07 ENCOUNTER — Ambulatory Visit (INDEPENDENT_AMBULATORY_CARE_PROVIDER_SITE_OTHER): Payer: PRIVATE HEALTH INSURANCE | Admitting: Family Medicine

## 2015-01-07 ENCOUNTER — Encounter: Payer: Self-pay | Admitting: Family Medicine

## 2015-01-07 VITALS — BP 103/75 | HR 117 | Temp 98.6°F | Resp 20 | Wt 250.5 lb

## 2015-01-07 DIAGNOSIS — J309 Allergic rhinitis, unspecified: Secondary | ICD-10-CM | POA: Diagnosis not present

## 2015-01-07 DIAGNOSIS — J029 Acute pharyngitis, unspecified: Secondary | ICD-10-CM

## 2015-01-07 LAB — POCT RAPID STREP A (OFFICE): RAPID STREP A SCREEN: NEGATIVE

## 2015-01-07 MED ORDER — AMOXICILLIN-POT CLAVULANATE 875-125 MG PO TABS: 1.0000 | ORAL_TABLET | Freq: Two times a day (BID) | ORAL | Status: DC

## 2015-01-07 MED ORDER — AMOXICILLIN 500 MG PO CAPS: 500.0000 mg | ORAL_CAPSULE | Freq: Three times a day (TID) | ORAL | Status: DC

## 2015-01-07 MED ORDER — FLUTICASONE PROPIONATE 50 MCG/ACT NA SUSP: 2.0000 | Freq: Every day | NASAL | Status: DC

## 2015-01-07 NOTE — Patient Instructions (Signed)
I have called in flonase (use at least 4 weeks) and augmentin for you take as prescribed.  Work excuse provided for today and tomorrow.  Get plenty of rest, hydrate, advil/tylenol for fever/headache.   Strep Throat Strep throat is a bacterial infection of the throat. Your health care provider may call the infection tonsillitis or pharyngitis, depending on whether there is swelling in the tonsils or at the back of the throat. Strep throat is most common during the cold months of the year in children who are 705-25 years of age, but it can happen during any season in people of any age. This infection is spread from person to person (contagious) through coughing, sneezing, or close contact. CAUSES Strep throat is caused by the bacteria called Streptococcus pyogenes. RISK FACTORS This condition is more likely to develop in:  People who spend time in crowded places where the infection can spread easily.  People who have close contact with someone who has strep throat. SYMPTOMS Symptoms of this condition include:  Fever or chills.   Redness, swelling, or pain in the tonsils or throat.  Pain or difficulty when swallowing.  White or yellow spots on the tonsils or throat.  Swollen, tender glands in the neck or under the jaw.  Red rash all over the body (rare). DIAGNOSIS This condition is diagnosed by performing a rapid strep test or by taking a swab of your throat (throat culture test). Results from a rapid strep test are usually ready in a few minutes, but throat culture test results are available after one or two days. TREATMENT This condition is treated with antibiotic medicine. HOME CARE INSTRUCTIONS Medicines  Take over-the-counter and prescription medicines only as told by your health care provider.  Take your antibiotic as told by your health care provider. Do not stop taking the antibiotic even if you start to feel better.  Have family members who also have a sore throat or fever  tested for strep throat. They may need antibiotics if they have the strep infection. Eating and Drinking  Do not share food, drinking cups, or personal items that could cause the infection to spread to other people.  If swallowing is difficult, try eating soft foods until your sore throat feels better.  Drink enough fluid to keep your urine clear or pale yellow. General Instructions  Gargle with a salt-water mixture 3-4 times per day or as needed. To make a salt-water mixture, completely dissolve -1 tsp of salt in 1 cup of warm water.  Make sure that all household members wash their hands well.  Get plenty of rest.  Stay home from school or work until you have been taking antibiotics for 24 hours.  Keep all follow-up visits as told by your health care provider. This is important. SEEK MEDICAL CARE IF:  The glands in your neck continue to get bigger.  You develop a rash, cough, or earache.  You cough up a thick liquid that is green, yellow-brown, or bloody.  You have pain or discomfort that does not get better with medicine.  Your problems seem to be getting worse rather than better.  You have a fever. SEEK IMMEDIATE MEDICAL CARE IF:  You have new symptoms, such as vomiting, severe headache, stiff or painful neck, chest pain, or shortness of breath.  You have severe throat pain, drooling, or changes in your voice.  You have swelling of the neck, or the skin on the neck becomes red and tender.  You have signs of  dehydration, such as fatigue, dry mouth, and decreased urination.  You become increasingly sleepy, or you cannot wake up completely.  Your joints become red or painful.   This information is not intended to replace advice given to you by your health care provider. Make sure you discuss any questions you have with your health care provider.   Document Released: 02/28/2000 Document Revised: 11/21/2014 Document Reviewed: 06/25/2014 Elsevier Interactive Patient  Education [employer]

## 2015-01-07 NOTE — Progress Notes (Signed)
   Subjective:    Patient ID: Krista Merritt, female    DOB: [birth year], 24 y.o.   MRN: [id number]  HPI  Cough: Patient presents for cough of one-week duration, that she states became worse on Saturday. She has noticed that she had blood-tinged sputum with cough. She has vomited multiple times over the last week. She states that she feels like her throat swollen and is painful. 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 She denies any nasal congestion, rhinorrhea, diarrhea or rash. She endorses intermittent fever/chills, decreased appetite. She has a history of allergies but does take antihistamines. She has tried to take Alka-Seltzer and TheraFlu, last dose was last night. She has no asthma history.  Past Medical History  Diagnosis Date  . Eczema 10/22/2014  . Obesity 10/22/2014  . Allergy     seasonal  . Preventative health care 11/04/2014   No Known Allergies . Social History   Social History  . Marital Status: Single    Spouse Name: N/A  . Number of Children: N/A  . Years of Education: N/A   Occupational History  . teacher pre K    Social History Main Topics  . Smoking status: Never Smoker   . Smokeless tobacco: Not on file  . Alcohol Use: Yes     Comment: Ocassionally  . Drug Use: No  . Sexual Activity:    Partners: Male    Birth Control/ Protection: None     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, preschool teacher   Other Topics Concern  . Not on file   Social History Narrative    Review of Systems Negative, with the exception of above mentioned in HPI     Objective:   Physical Exam BP 103/75 mmHg  Pulse 117  Temp(Src) 98.6 F (37 C) (Oral)  Resp 20  Wt 250 lb 8 oz (113.626 kg)  SpO2 95%  LMP 12/21/2014 Gen: Afebrile. No acute distress. Nontoxic in appearance, well-developed, well-nourished, female HENT: AT. [place]. Bilateral TM visualized and normal in appearance. MMM. Bilateral nares with erythema and swelling. Throat with  erythema and exudates. Hoarseness present on exam. No cough present on exam. Eyes:Pupils Equal Round Reactive to light, Extraocular movements intact,  Conjunctiva without redness, discharge or icterus. Neck/lymp/endocrine: Supple, mild anterior cervical lymphadenopathy CV: Tachycardic, regular rhythm Chest: CTAB, no wheeze or crackles Abd: Soft. Round. NTND. BS present. No Masses palpated.  Skin: No rashes, purpura or petechiae.  Neuro: Normal gait. PERLA. EOMi. Alert. Oriented x3     Assessment & Plan:  1. Acute pharyngitis, unspecified etiology - Patient with exudative pharyngitis, will treat with amoxicillin 500 mg 3 times a day for 10 days. Augmentin had been prescribed, patient was unable to afford. - POCT rapid strep A --> negative - Work excuse provided for today and tomorrow. - Patient encouraged to rest, hydrate, use Advil/Tylenol for any headaches fevers or pains.  2. Allergic rhinitis- fluticasone (FLONASE) 50 MCG/ACT nasal spray; Place 2 sprays  into both nostrils daily.  Dispense: 16 g; Refill: 6  .  F/u 1 week if no improvement.

## 2015-03-17 NOTE — L&D Delivery Note (Signed)
Delivery Note  Pt progressed steadily through labor.  She had a few isoloated severe range (systolic only) BPs.  After a 5 minute 2nd stage, At 11:28 PM a viable female was delivered via Vaginal, Spontaneous Delivery (Presentation: LOA;  ).  APGAR: 9, 9; weight  .pending. After 2 minutes, the cord was clamped and cut. 40 units of pitocin diluted in 1000cc LR was infused rapidly IV.  The placenta separated spontaneously and delivered via CCT and maternal pushing effort.  It was inspected and appears to be intact with a 3 VC.      Anesthesia: epidural and local  Episiotomy: None Lacerations:  2nd degree Suture Repair: 2.0 vicryl Est. Blood Loss (mL):  100  Mom to postpartum.  Baby to Couplet care / Skin to Skin.  The above by Dr Luan PullingFItzpatrick under my direct supervision.   CRESENZO-DISHMAN,Joscelyne Renville 02/06/2016, 11:51 PM

## 2015-04-18 ENCOUNTER — Other Ambulatory Visit: Payer: PRIVATE HEALTH INSURANCE

## 2015-04-25 ENCOUNTER — Ambulatory Visit: Payer: PRIVATE HEALTH INSURANCE | Admitting: Family Medicine

## 2015-04-25 ENCOUNTER — Telehealth: Payer: Self-pay | Admitting: Family Medicine

## 2015-04-25 NOTE — Telephone Encounter (Signed)
LVM inquiring if patient received flu shot  °

## 2015-05-31 ENCOUNTER — Ambulatory Visit (INDEPENDENT_AMBULATORY_CARE_PROVIDER_SITE_OTHER): Payer: PRIVATE HEALTH INSURANCE | Admitting: *Deleted

## 2015-05-31 VITALS — BP 132/87 | HR 91 | Temp 98.4°F | Wt 249.0 lb

## 2015-05-31 DIAGNOSIS — N926 Irregular menstruation, unspecified: Secondary | ICD-10-CM

## 2015-05-31 DIAGNOSIS — Z3201 Encounter for pregnancy test, result positive: Secondary | ICD-10-CM

## 2015-05-31 LAB — POCT URINE PREGNANCY: Preg Test, Ur: POSITIVE — AB

## 2015-05-31 NOTE — Progress Notes (Signed)
Patient in office for a confirmation of pregnancy. Patient states she had several positive home pregnancy. Patient's pregnancy test in office is positive. Patient states this is not an intended pregnancy but she does wish to continue it. Patient encouraged to start prenatal vitamins and to schedule NOB appointment.   BP 132/87 mmHg  Pulse 91  Temp(Src) 98.4 F (36.9 C)  Wt 249 lb (112.946 kg)  LMP 04/29/2015

## 2015-06-07 NOTE — Telephone Encounter (Signed)
Patient is currently pregnant

## 2015-07-11 ENCOUNTER — Encounter: Payer: Self-pay | Admitting: Obstetrics

## 2015-07-11 ENCOUNTER — Ambulatory Visit (INDEPENDENT_AMBULATORY_CARE_PROVIDER_SITE_OTHER): Payer: PRIVATE HEALTH INSURANCE | Admitting: Obstetrics

## 2015-07-11 VITALS — BP 120/82 | HR 88 | Wt 238.0 lb

## 2015-07-11 DIAGNOSIS — J301 Allergic rhinitis due to pollen: Secondary | ICD-10-CM

## 2015-07-11 DIAGNOSIS — Z3491 Encounter for supervision of normal pregnancy, unspecified, first trimester: Secondary | ICD-10-CM

## 2015-07-11 LAB — POCT URINALYSIS DIPSTICK
BILIRUBIN UA: NEGATIVE
Blood, UA: NEGATIVE
GLUCOSE UA: NEGATIVE
KETONES UA: NEGATIVE
LEUKOCYTES UA: NEGATIVE
Nitrite, UA: NEGATIVE
Protein, UA: NEGATIVE
Spec Grav, UA: 1.015
Urobilinogen, UA: NEGATIVE
pH, UA: 6

## 2015-07-11 MED ORDER — PRENATE MINI 29-0.6-0.4-350 MG PO CAPS: 1.0000 | ORAL_CAPSULE | Freq: Every day | ORAL | Status: DC

## 2015-07-11 MED ORDER — LORATADINE 10 MG PO TABS
10.0000 mg | ORAL_TABLET | Freq: Every day | ORAL | Status: DC
Start: 2015-07-11 — End: 2015-11-24

## 2015-07-12 ENCOUNTER — Encounter: Payer: Self-pay | Admitting: Obstetrics

## 2015-07-12 LAB — PRENATAL PROFILE I(LABCORP)
Antibody Screen: NEGATIVE
BASOS: 0 %
Basophils Absolute: 0 10*3/uL (ref 0.0–0.2)
EOS (ABSOLUTE): 0.2 10*3/uL (ref 0.0–0.4)
Eos: 2 %
HEMATOCRIT: 39.3 % (ref 34.0–46.6)
HEP B S AG: NEGATIVE
Hemoglobin: 12.6 g/dL (ref 11.1–15.9)
IMMATURE GRANS (ABS): 0 10*3/uL (ref 0.0–0.1)
Immature Granulocytes: 0 %
LYMPHS: 26 %
Lymphocytes Absolute: 1.9 10*3/uL (ref 0.7–3.1)
MCH: 22.1 pg — ABNORMAL LOW (ref 26.6–33.0)
MCHC: 32.1 g/dL (ref 31.5–35.7)
MCV: 69 fL — ABNORMAL LOW (ref 79–97)
MONOCYTES: 7 %
Monocytes Absolute: 0.5 10*3/uL (ref 0.1–0.9)
Neutrophils Absolute: 4.7 10*3/uL (ref 1.4–7.0)
Neutrophils: 65 %
Platelets: 331 10*3/uL (ref 150–379)
RBC: 5.7 x10E6/uL — ABNORMAL HIGH (ref 3.77–5.28)
RDW: 15.7 % — AB (ref 12.3–15.4)
RPR: NONREACTIVE
RUBELLA: 9.35 {index} (ref >0.99)
Rh Factor: POSITIVE
WBC: 7.2 10*3/uL (ref 3.4–10.8)

## 2015-07-12 LAB — HEMOGLOBINOPATHY EVALUATION
HEMOGLOBIN F QUANTITATION: 0 % (ref 0.0–2.0)
HGB A: 98 % (ref 94.0–98.0)
HGB C: 0 %
HGB S: 0 %
Hemoglobin A2 Quantitation: 2 % (ref 0.7–3.1)

## 2015-07-12 LAB — HIV ANTIBODY (ROUTINE TESTING W REFLEX): HIV SCREEN 4TH GENERATION: NONREACTIVE

## 2015-07-12 LAB — VITAMIN D 25 HYDROXY (VIT D DEFICIENCY, FRACTURES): VIT D 25 HYDROXY: 18.7 ng/mL — AB (ref 30.0–100.0)

## 2015-07-12 NOTE — Progress Notes (Signed)
Subjective:    Krista Merritt is being seen today for her first obstetrical visit.  This is not a planned pregnancy. She is at [redacted]w[redacted]d gestation. Her obstetrical history is significant for none. Relationship with FOB: significant other, not living together. Patient does intend to breast feed. Pregnancy history fully reviewed.  The information documented in the HPI was reviewed and verified.  Menstrual History: OB History    Gravida Para Term Preterm AB TAB SAB Ectopic Multiple Living         Patient's last menstrual period was 04/29/2015.    Past Medical History  Diagnosis Date  . Eczema 10/22/2014  . Obesity 10/22/2014  . Allergy     seasonal  . Preventative health care 11/04/2014  . Medical history non-contributory     Past Surgical History  Procedure Laterality Date  . Colposcopy  2012    abnormal cells, cervix, roughly 2012, normal     (Not in a hospital admission) No Known Allergies  Social History  Substance Use Topics  . Smoking status: Never Smoker   . Smokeless tobacco: Not on file  . Alcohol Use: No     Comment: Ocassionally    Family History  Problem Relation Age of Onset  . Mental illness Father     suicide  . Diabetes Maternal Grandmother   . Asthma Maternal Grandmother   . COPD Paternal Grandmother     recurrent bronchitis  . Arthritis Paternal Grandmother      Review of Systems Constitutional: negative for weight loss Gastrointestinal: negative for vomiting Genitourinary:negative for genital lesions and vaginal discharge and dysuria Musculoskeletal:negative for back pain Behavioral/Psych: negative for abusive relationship, depression, illegal drug usage and tobacco use    Objective:    BP 120/82 mmHg  Pulse 88  Wt 238 lb (107.956 kg)  LMP 04/29/2015 General Appearance:    Alert, cooperative, no distress, appears stated age  Head:    Normocephalic, without obvious abnormality, atraumatic  Eyes:    PERRL,  conjunctiva/corneas clear, EOM's intact, fundi    benign, both eyes  Ears:    Normal TM's and external ear canals, both ears  Nose:   Nares normal, septum midline, mucosa normal, no drainage    or sinus tenderness  Throat:   Lips, mucosa, and tongue normal; teeth and gums normal  Neck:   Supple, symmetrical, trachea midline, no adenopathy;    thyroid:  no enlargement/tenderness/nodules; no carotid   bruit or JVD  Back:     Symmetric, no curvature, ROM normal, no CVA tenderness  Lungs:     Clear to auscultation bilaterally, respirations unlabored  Chest Wall:    No tenderness or deformity   Heart:    Regular rate and rhythm, S1 and S2 normal, no murmur, rub   or gallop  Breast Exam:    No tenderness, masses, or nipple abnormality  Abdomen:     Soft, non-tender, bowel sounds active all four quadrants,    no masses, no organomegaly  Genitalia:    Normal female without lesion, discharge or tenderness  Extremities:   Extremities normal, atraumatic, no cyanosis or edema  Pulses:   2+ and symmetric all extremities  Skin:   Skin color, texture, turgor normal, no rashes or lesions  Lymph nodes:   Cervical, supraclavicular, and axillary nodes normal  Neurologic:   CNII-XII intact, normal strength, sensation and reflexes    throughout      Lab Review  Urine pregnancy test Labs reviewed yes Radiologic studies reviewed no Assessment:    Pregnancy at 3034w4d weeks    Rhinitis   Plan:     Claritin Rx Prenatal vitamins.  Counseling provided regarding continued use of seat belts, cessation of alcohol consumption, smoking or use of illicit drugs; infection precautions i.e., influenza/TDAP immunizations, toxoplasmosis,CMV, parvovirus, listeria and varicella; workplace safety, exercise during pregnancy; routine dental care, safe medications, sexual activity, hot tubs, saunas, pools, travel, caffeine use, fish and methlymercury, potential toxins, hair treatments, varicose veins Weight gain  recommendations per IOM guidelines reviewed: underweight/BMI< 18.5--> gain 28 - 40 lbs; normal weight/BMI 18.5 - 24.9--> gain 25 - 35 lbs; overweight/BMI 25 - 29.9--> gain 15 - 25 lbs; obese/BMI >30->gain  11 - 20 lbs Problem list reviewed and updated. FIRST/CF mutation testing/NIPT/QUAD SCREEN/fragile X/[name] population testing/Spinal muscular atrophy discussed: requested. Role of ultrasound in pregnancy discussed; fetal survey: requested. Amniocentesis discussed: not indicated. VBAC calculator score: VBAC consent form provided Meds ordered this encounter  Medications  . Prenat w/o A-FeCbn-Meth-FA-DHA (PRENATE MINI) 29-0.6-0.4-350 MG CAPS    Sig: Take 1 capsule by mouth daily before breakfast.    Dispense:  90 capsule    Refill:  3  . loratadine (CLARITIN) 10 MG tablet    Sig: Take 1 tablet (10 mg total) by mouth daily.    Dispense:  30 tablet    Refill:  11   Orders Placed This Encounter  Procedures  . Culture, OB Urine  . Prenatal Profile I  . HIV antibody  . Hemoglobinopathy evaluation  . VITAMIN D 25 Hydroxy (Vit-D Deficiency, Fractures)  . POCT urinalysis dipstick    Follow up in 4 weeks.

## 2015-07-13 LAB — CULTURE, OB URINE

## 2015-07-13 LAB — URINE CULTURE, OB REFLEX

## 2015-07-15 ENCOUNTER — Other Ambulatory Visit: Payer: Self-pay | Admitting: Obstetrics

## 2015-07-15 DIAGNOSIS — N760 Acute vaginitis: Principal | ICD-10-CM

## 2015-07-15 DIAGNOSIS — B9689 Other specified bacterial agents as the cause of diseases classified elsewhere: Secondary | ICD-10-CM

## 2015-07-15 LAB — NUSWAB VG+, CANDIDA 6SP
Atopobium vaginae: HIGH Score — AB
BVAB 2: HIGH Score — AB
CANDIDA ALBICANS, NAA: NEGATIVE
CANDIDA LUSITANIAE, NAA: NEGATIVE
CANDIDA PARAPSILOSIS, NAA: NEGATIVE
CANDIDA TROPICALIS, NAA: NEGATIVE
CHLAMYDIA TRACHOMATIS, NAA: NEGATIVE
Candida glabrata, NAA: NEGATIVE
Candida krusei, NAA: NEGATIVE
Megasphaera 1: HIGH Score — AB
Neisseria gonorrhoeae, NAA: NEGATIVE
TRICH VAG BY NAA: NEGATIVE

## 2015-07-15 MED ORDER — METRONIDAZOLE 500 MG PO TABS: 500.0000 mg | ORAL_TABLET | Freq: Two times a day (BID) | ORAL | Status: DC

## 2015-07-18 ENCOUNTER — Other Ambulatory Visit: Payer: Self-pay | Admitting: Certified Nurse Midwife

## 2015-08-05 ENCOUNTER — Other Ambulatory Visit: Payer: Self-pay | Admitting: Obstetrics

## 2015-08-05 ENCOUNTER — Telehealth: Payer: Self-pay | Admitting: *Deleted

## 2015-08-05 DIAGNOSIS — B9689 Other specified bacterial agents as the cause of diseases classified elsewhere: Secondary | ICD-10-CM

## 2015-08-05 DIAGNOSIS — N760 Acute vaginitis: Principal | ICD-10-CM

## 2015-08-05 MED ORDER — CLINDAMYCIN HCL 300 MG PO CAPS: 300.0000 mg | ORAL_CAPSULE | Freq: Three times a day (TID) | ORAL | Status: DC

## 2015-08-05 NOTE — Telephone Encounter (Signed)
Clindamycin Rx

## 2015-08-05 NOTE — Telephone Encounter (Signed)
Patient is calling about her BV treatment. 4:57 Patient was taking ATB for BV- patient was having a bad taste in her mouth- she needs an alternative because it made her sick. She needs an alternative treatment.

## 2015-08-06 NOTE — Telephone Encounter (Signed)
Patient notified per VM 

## 2015-08-08 ENCOUNTER — Ambulatory Visit (INDEPENDENT_AMBULATORY_CARE_PROVIDER_SITE_OTHER): Payer: PRIVATE HEALTH INSURANCE | Admitting: Obstetrics

## 2015-08-08 VITALS — BP 131/74 | HR 86 | Wt 237.0 lb

## 2015-08-08 DIAGNOSIS — Z3492 Encounter for supervision of normal pregnancy, unspecified, second trimester: Secondary | ICD-10-CM

## 2015-08-08 LAB — POCT URINALYSIS DIPSTICK
BILIRUBIN UA: NEGATIVE
Blood, UA: NEGATIVE
GLUCOSE UA: NEGATIVE
KETONES UA: NEGATIVE
LEUKOCYTES UA: NEGATIVE
Nitrite, UA: NEGATIVE
Protein, UA: NEGATIVE
Spec Grav, UA: 1.015
Urobilinogen, UA: 1
pH, UA: 6

## 2015-08-08 NOTE — Progress Notes (Signed)
Some cramping in left thigh

## 2015-08-12 ENCOUNTER — Encounter: Payer: Self-pay | Admitting: Obstetrics

## 2015-08-12 NOTE — Progress Notes (Signed)
  Subjective:    Krista Merritt is a 25 y.o. female being seen today for her obstetrical visit. She is at 6980w0d gestation. Patient reports: no complaints.  Problem List Items Addressed This Visit    None    Visit Diagnoses    Prenatal care in second trimester    -  Primary    Relevant Orders    POCT Urinalysis Dipstick (Completed)      Patient Active Problem List   Diagnosis Date Noted  . Acute pharyngitis 01/07/2015  . Allergic rhinitis 01/07/2015  . Preventative health care 11/04/2014  . Allergy   . Eczema 10/22/2014  . Obesity 10/22/2014  . BV (bacterial vaginosis) 06/13/2013    Objective:     BP 131/74 mmHg  Pulse 86  Wt 237 lb (107.502 kg)  LMP 04/29/2015 Uterine Size: Below umbilicus     Assessment:    Pregnancy @ 2880w0d  weeks Doing well    Plan:    Problem list reviewed and updated. Labs reviewed.  Follow up in 4 weeks. FIRST/CF mutation testing/NIPT/QUAD SCREEN/fragile X/[name] population testing/Spinal muscular atrophy discussed: requested. Role of ultrasound in pregnancy discussed; fetal survey: requested. Amniocentesis discussed: not indicated.

## 2015-08-22 ENCOUNTER — Encounter: Payer: PRIVATE HEALTH INSURANCE | Admitting: Obstetrics

## 2015-08-30 ENCOUNTER — Other Ambulatory Visit: Payer: Self-pay | Admitting: Certified Nurse Midwife

## 2015-09-02 ENCOUNTER — Ambulatory Visit (INDEPENDENT_AMBULATORY_CARE_PROVIDER_SITE_OTHER): Payer: PRIVATE HEALTH INSURANCE | Admitting: Obstetrics

## 2015-09-02 ENCOUNTER — Encounter: Payer: Self-pay | Admitting: Obstetrics

## 2015-09-02 VITALS — BP 123/79 | HR 87 | Temp 99.5°F | Wt 242.0 lb

## 2015-09-02 DIAGNOSIS — J301 Allergic rhinitis due to pollen: Secondary | ICD-10-CM

## 2015-09-02 DIAGNOSIS — Z3492 Encounter for supervision of normal pregnancy, unspecified, second trimester: Secondary | ICD-10-CM

## 2015-09-02 DIAGNOSIS — Z3689 Encounter for other specified antenatal screening: Secondary | ICD-10-CM

## 2015-09-02 DIAGNOSIS — J0110 Acute frontal sinusitis, unspecified: Secondary | ICD-10-CM

## 2015-09-02 LAB — POCT URINALYSIS DIPSTICK
Bilirubin, UA: NEGATIVE
Blood, UA: NEGATIVE
Glucose, UA: NEGATIVE
Ketones, UA: NEGATIVE
Leukocytes, UA: NEGATIVE
Nitrite, UA: NEGATIVE
Spec Grav, UA: 1.015
Urobilinogen, UA: NEGATIVE
pH, UA: 7

## 2015-09-02 NOTE — Progress Notes (Signed)
  Subjective:    Krista Merritt is a 25 y.o. female being seen today for her obstetrical visit. She is at 3685w0d gestation. Patient reports: cough and greenish nasal discharge.  Problem List Items Addressed This Visit    None    Visit Diagnoses    Prenatal care in second trimester    -  Primary    Relevant Orders    POCT urinalysis dipstick (Completed)    Encounter for fetal anatomic survey        Relevant Orders    US OB Comp + 14 Wk    US OB Transvaginal    Acute frontal sinusitis, recurrence not specified        Rhinitis due to pollen          Patient Active Problem List   Diagnosis Date Noted  . Acute pharyngitis 01/07/2015  . Allergic rhinitis 01/07/2015  . Preventative health care 11/04/2014  . Allergy   . Eczema 10/22/2014  . Obesity 10/22/2014  . BV (bacterial vaginosis) 06/13/2013    Objective:     BP 123/79 mmHg  Pulse 87  Temp(Src) 99.5 F (37.5 C)  Wt 242 lb (109.77 kg)  LMP 04/29/2015 Uterine Size: Below umbilicus     Assessment:    Pregnancy @ 7385w0d  weeks Sinusitis Allergic rhinitis  Plan:    Azithromycin Rx Claritin Rx  Problem list reviewed and updated. Labs reviewed.  Follow up in 4 weeks. FIRST/CF mutation testing/NIPT/QUAD SCREEN/fragile X/[name] population testing/Spinal muscular atrophy discussed: declined. Role of ultrasound in pregnancy discussed; fetal survey: declined. Amniocentesis discussed: not indicated.

## 2015-09-19 ENCOUNTER — Ambulatory Visit (INDEPENDENT_AMBULATORY_CARE_PROVIDER_SITE_OTHER): Payer: PRIVATE HEALTH INSURANCE | Admitting: Obstetrics

## 2015-09-19 ENCOUNTER — Ambulatory Visit (INDEPENDENT_AMBULATORY_CARE_PROVIDER_SITE_OTHER): Payer: PRIVATE HEALTH INSURANCE

## 2015-09-19 VITALS — BP 125/85 | HR 85 | Temp 98.3°F | Wt 247.0 lb

## 2015-09-19 DIAGNOSIS — Z36 Encounter for antenatal screening of mother: Secondary | ICD-10-CM | POA: Diagnosis not present

## 2015-09-19 DIAGNOSIS — Z3689 Encounter for other specified antenatal screening: Secondary | ICD-10-CM

## 2015-09-19 DIAGNOSIS — Z3492 Encounter for supervision of normal pregnancy, unspecified, second trimester: Secondary | ICD-10-CM

## 2015-09-26 ENCOUNTER — Encounter: Payer: Self-pay | Admitting: Obstetrics

## 2015-09-26 NOTE — Progress Notes (Signed)
Subjective:    Krista MilroyShamayia E Merritt is a 25 y.o. female being seen today for her obstetrical visit. She is at 6982w3d gestation. Patient reports: no complaints . Fetal movement: normal.  Problem List Items Addressed This Visit    None     Patient Active Problem List   Diagnosis Date Noted  . Acute pharyngitis 01/07/2015  . Allergic rhinitis 01/07/2015  . Preventative health care 11/04/2014  . Allergy   . Eczema 10/22/2014  . Obesity 10/22/2014  . BV (bacterial vaginosis) 06/13/2013   Objective:    BP 125/85 mmHg  Pulse 85  Temp(Src) 98.3 F (36.8 C)  Wt 247 lb (112.038 kg)  LMP 04/29/2015 FHT: 150 BPM  Uterine Size: size equals dates     Assessment:    Pregnancy @ 3382w3d    Plan:    Signs and symptoms of preterm labor: discussed.  Labs, problem list reviewed and updated 2 hr GTT planned Follow up in 4 weeks.

## 2015-10-17 ENCOUNTER — Ambulatory Visit (INDEPENDENT_AMBULATORY_CARE_PROVIDER_SITE_OTHER): Payer: PRIVATE HEALTH INSURANCE | Admitting: Obstetrics

## 2015-10-17 VITALS — BP 116/74 | HR 92 | Temp 98.2°F | Wt 252.8 lb

## 2015-10-17 DIAGNOSIS — Z3402 Encounter for supervision of normal first pregnancy, second trimester: Secondary | ICD-10-CM

## 2015-10-17 LAB — POCT URINALYSIS DIPSTICK
Bilirubin, UA: NEGATIVE
Blood, UA: NEGATIVE
Glucose, UA: NEGATIVE
Ketones, UA: NEGATIVE
Leukocytes, UA: NEGATIVE
Nitrite, UA: NEGATIVE
Spec Grav, UA: 1.015
Urobilinogen, UA: 0.2
pH, UA: 6

## 2015-10-17 NOTE — Progress Notes (Signed)
Patient has pain and pressure- mostly pressure on her bladder. Patient is having shooting pain in her R leg- upper thigh area.

## 2015-10-23 ENCOUNTER — Encounter: Payer: Self-pay | Admitting: Obstetrics

## 2015-10-23 NOTE — Progress Notes (Signed)
Patient ID: Krista Merritt, female   DOB: 08-02-1990, [age] y.o.   MRN: [id number] Subjective:    Krista Merritt is a 25 y.o. female being seen today for her obstetrical visit. She is at 4758w2d gestation. Patient reports: no complaints . Fetal movement: normal.  Problem List Items Addressed This Visit    None    Visit Diagnoses    Encounter for supervision of normal first pregnancy in second trimester    -  Primary   Relevant Orders   POCT urinalysis dipstick (Completed)     Patient Active Problem List   Diagnosis Date Noted  . Acute pharyngitis 01/07/2015  . Allergic rhinitis 01/07/2015  . Preventative health care 11/04/2014  . Allergy   . Eczema 10/22/2014  . Obesity 10/22/2014  . BV (bacterial vaginosis) 06/13/2013   Objective:    BP 116/74   Pulse 92   Temp 98.2 F (36.8 C)   Wt 252 lb 12.8 oz (114.7 kg)   LMP 04/29/2015   BMI 42.07 kg/m  FHT: 150 BPM  Uterine Size: size equals dates     Assessment:    Pregnancy @ 3658w2d    Plan:    Signs and symptoms of preterm labor: discussed.  Labs, problem list reviewed and updated 2 hr GTT planned Follow up in 3 weeks.

## 2015-11-05 ENCOUNTER — Encounter: Payer: Self-pay | Admitting: Internal Medicine

## 2015-11-05 ENCOUNTER — Ambulatory Visit (INDEPENDENT_AMBULATORY_CARE_PROVIDER_SITE_OTHER): Payer: PRIVATE HEALTH INSURANCE | Admitting: Internal Medicine

## 2015-11-05 DIAGNOSIS — M79604 Pain in right leg: Secondary | ICD-10-CM | POA: Diagnosis not present

## 2015-11-05 MED ORDER — DICLOFENAC SODIUM 1 % TD GEL: 4.0000 g | Freq: Four times a day (QID) | TRANSDERMAL | 1 refills | Status: DC | PRN

## 2015-11-05 NOTE — Assessment & Plan Note (Signed)
C/w tendonitis, for volt gel topical prn if OK with OB, o/w tylenol prn, avoid prolonged standing, bending or prolonged leg extension,  to f/u any worsening symptoms or concerns

## 2015-11-05 NOTE — Progress Notes (Signed)
Pre visit review using our clinic review tool, if applicable. No additional management support is needed unless otherwise documented below in the visit note. 

## 2015-11-05 NOTE — Progress Notes (Signed)
Subjective:    Patient ID: Krista MilroyShamayia E Merritt, female    DOB: [birth year], 25 y.o.   MRN: [id number]  HPI  Here to f/u at 26wks gravid, with c/o right post leg pain, tender to a certain area posterolat, worse to bend at waist, stand for longer periods of time, leg elevation with straight leg, and worse to walk.  Better to sit and flex knee.  No specific knee pain, swelling, fever, trauma, fall though at times she worries it will just give away.  No LBP or radicular pain.  Pt reqeusts BMP for K and glc bc read something on google about K and leg pain. Past Medical History:  Diagnosis Date  . Allergy    seasonal  . Eczema 10/22/2014  . Medical history non-contributory   . Obesity 10/22/2014  . Preventative health care 11/04/2014   Past Surgical History:  Procedure Laterality Date  . COLPOSCOPY  2012   abnormal cells, cervix, roughly 2012, normal    reports that she has never smoked. She does not have any smokeless tobacco history on file. She reports that she does not drink alcohol or use drugs. family history includes Arthritis in her paternal grandmother; Asthma in her maternal grandmother; COPD in her paternal grandmother; Diabetes in her maternal grandmother; Mental illness in her father. No Known Allergies Current Outpatient Prescriptions on File Prior to Visit  Medication Sig Dispense Refill  . fluticasone (FLONASE) 50 MCG/ACT nasal spray PLACE 2 SPRAYS INTO BOTH NOSTRILS DAILY.  6  . loratadine (CLARITIN) 10 MG tablet Take 1 tablet (10 mg total) by mouth daily. 30 tablet 11  . metroNIDAZOLE (FLAGYL) 500 MG tablet TAKE 1 TABLET (500 MG TOTAL) BY MOUTH 2 (TWO) TIMES DAILY.  2  . Prenat w/o A-FeCbn-Meth-FA-DHA (PRENATE MINI) 29-0.6-0.4-350 MG CAPS Take 1 capsule by mouth daily before breakfast. 90 capsule 3  . Prenat-FeCbn-FeAsp-Meth-FA-DHA (PRENATE MINI) 18-0.6-0.4-350 MG CAPS TAKE 1 CAPSULE BY MOUTH DAILY BEFORE BREAKFAST.  3   No current facility-administered medications on file prior  to visit.     Review of Systems  Constitutional: Negative for unusual diaphoresis or night sweats HENT: Negative for ear swelling or discharge Eyes: Negative for worsening visual haziness  Respiratory: Negative for choking and stridor.   Gastrointestinal: Negative for distension or worsening eructation Genitourinary: Negative for retention or change in urine volume.  Musculoskeletal: Negative for other MSK pain or swelling Skin: Negative for color change and worsening wound Neurological: Negative for tremors and numbness other than noted  Psychiatric/Behavioral: Negative for decreased concentration or agitation other than above       Objective:   Physical Exam BP 130/66   Pulse 88   Resp 20   Wt 259 lb (117.5 kg)   LMP 04/29/2015   SpO2 97%   BMI 43.10 kg/m  VS noted, gravid Constitutional: Pt appears in no apparent distress HENT: Head: NCAT.  Right Ear: External ear normal.  Left Ear: External ear normal.  Eyes: . Pupils are equal, round, and reactive to light. Conjunctivae and EOM are normal Neck: Normal range of motion. Neck supple.  Cardiovascular: Normal rate and regular rhythm.   Pulmonary/Chest: Effort normal and breath sounds without rales or wheezing.  Neurological: Pt is alert. Not confused , motor grossly intact Skin: Skin is warm. No rash, no LE edema Psychiatric: Pt behavior is normal. No agitation.  Spine nontender, right thigh anterior nontender nonswelling, no rash Right upper leg/thigh posterior with tenderness to post lat hamstring insertion site without  swelling     Assessment & Plan:

## 2015-11-05 NOTE — Patient Instructions (Signed)
Please take all new medication as prescribed - the generic for voltaren gel topical (but only if OK with your OB)  Please continue all other medications as before, and refills have been done if requested.  Please have the pharmacy call with any other refills you may need.  Please keep your appointments with your specialists as you may have planned  Please go to the LAB in the Basement (turn left off the elevator) for the tests to be done tomorrow  You will be contacted by phone if any changes need to be made immediately.  Otherwise, you will receive a letter about your results with an explanation, but please check with MyChart first.  Please remember to sign up for MyChart if you have not done so, as this will be important to you in the future with finding out test results, communicating by private email, and scheduling acute appointments online when needed.

## 2015-11-07 ENCOUNTER — Inpatient Hospital Stay (HOSPITAL_COMMUNITY)
Admission: AD | Admit: 2015-11-07 | Discharge: 2015-11-07 | Disposition: A | Discharge status: Home / Self Care | Payer: No Typology Code available for payment source | Source: Ambulatory Visit | Attending: Obstetrics and Gynecology | Admitting: Obstetrics and Gynecology

## 2015-11-07 ENCOUNTER — Encounter (HOSPITAL_COMMUNITY): Payer: Self-pay | Admitting: *Deleted

## 2015-11-07 DIAGNOSIS — Z3492 Encounter for supervision of normal pregnancy, unspecified, second trimester: Secondary | ICD-10-CM

## 2015-11-07 DIAGNOSIS — O99612 Diseases of the digestive system complicating pregnancy, second trimester: Secondary | ICD-10-CM | POA: Insufficient documentation

## 2015-11-07 DIAGNOSIS — R109 Unspecified abdominal pain: Secondary | ICD-10-CM | POA: Diagnosis present

## 2015-11-07 DIAGNOSIS — Z3A27 27 weeks gestation of pregnancy: Secondary | ICD-10-CM | POA: Insufficient documentation

## 2015-11-07 DIAGNOSIS — R1030 Lower abdominal pain, unspecified: Secondary | ICD-10-CM | POA: Diagnosis not present

## 2015-11-07 DIAGNOSIS — O9989 Other specified diseases and conditions complicating pregnancy, childbirth and the puerperium: Secondary | ICD-10-CM

## 2015-11-07 DIAGNOSIS — Z3689 Encounter for other specified antenatal screening: Secondary | ICD-10-CM

## 2015-11-07 DIAGNOSIS — O99212 Obesity complicating pregnancy, second trimester: Secondary | ICD-10-CM | POA: Diagnosis not present

## 2015-11-07 DIAGNOSIS — Z36 Encounter for antenatal screening of mother: Secondary | ICD-10-CM

## 2015-11-07 DIAGNOSIS — O26899 Other specified pregnancy related conditions, unspecified trimester: Secondary | ICD-10-CM

## 2015-11-07 DIAGNOSIS — K5900 Constipation, unspecified: Secondary | ICD-10-CM | POA: Diagnosis not present

## 2015-11-07 DIAGNOSIS — N898 Other specified noninflammatory disorders of vagina: Secondary | ICD-10-CM | POA: Diagnosis present

## 2015-11-07 LAB — WET PREP, GENITAL
CLUE CELLS WET PREP: NONE SEEN
Sperm: NONE SEEN
Trich, Wet Prep: NONE SEEN
WBC WET PREP: NONE SEEN
YEAST WET PREP: NONE SEEN

## 2015-11-07 LAB — URINALYSIS, ROUTINE W REFLEX MICROSCOPIC
Bilirubin Urine: NEGATIVE
Glucose, UA: NEGATIVE mg/dL
Hgb urine dipstick: NEGATIVE
Ketones, ur: NEGATIVE mg/dL
LEUKOCYTES UA: NEGATIVE
NITRITE: NEGATIVE
Protein, ur: NEGATIVE mg/dL
Specific Gravity, Urine: 1.025 (ref 1.005–1.030)
pH: 6 (ref 5.0–8.0)

## 2015-11-07 NOTE — Discharge Instructions (Signed)
Constipation, Adult °Constipation is when a person has fewer than three bowel movements a week, has difficulty having a bowel movement, or has stools that are dry, hard, or larger than normal. As people grow older, constipation is more common. A low-fiber diet, not taking in enough fluids, and taking certain medicines may make constipation worse.  °CAUSES  °· Certain medicines, such as antidepressants, pain medicine, iron supplements, antacids, and water pills.   °· Certain diseases, such as diabetes, irritable bowel syndrome (IBS), thyroid disease, or depression.   °· Not drinking enough water.   °· Not eating enough fiber-rich foods.   °· Stress or travel.   °· Lack of physical activity or exercise.   °· Ignoring the urge to have a bowel movement.   °· Using laxatives too much.   °SIGNS AND SYMPTOMS  °· Having fewer than three bowel movements a week.   °· Straining to have a bowel movement.   °· Having stools that are hard, dry, or larger than normal.   °· Feeling full or bloated.   °· Pain in the lower abdomen.   °· Not feeling relief after having a bowel movement.   °DIAGNOSIS  °Your health care provider will take a medical history and perform a physical exam. Further testing may be done for severe constipation. Some tests may include: °· A barium enema X-ray to examine your rectum, colon, and, sometimes, your small intestine.   °· A sigmoidoscopy to examine your lower colon.   °· A colonoscopy to examine your entire colon. °TREATMENT  °Treatment will depend on the severity of your constipation and what is causing it. Some dietary treatments include drinking more fluids and eating more fiber-rich foods. Lifestyle treatments may include regular exercise. If these diet and lifestyle recommendations do not help, your health care provider may recommend taking over-the-counter laxative medicines to help you have bowel movements. Prescription medicines may be prescribed if over-the-counter medicines do not work.    °HOME CARE INSTRUCTIONS  °· Eat foods that have a lot of fiber, such as fruits, vegetables, whole grains, and beans. °· Limit foods high in fat and processed sugars, such as french fries, hamburgers, cookies, candies, and soda.   °· A fiber supplement may be added to your diet if you cannot get enough fiber from foods.   °· Drink enough fluids to keep your urine clear or pale yellow.   °· Exercise regularly or as directed by your health care provider.   °· Go to the restroom when you have the urge to go. Do not hold it.   °· Only take over-the-counter or prescription medicines as directed by your health care provider. Do not take other medicines for constipation without talking to your health care provider first.   °SEEK IMMEDIATE MEDICAL CARE IF:  °· You have bright red blood in your stool.   °· Your constipation lasts for more than 4 days or gets worse.   °· You have abdominal or rectal pain.   °· You have thin, pencil-like stools.   °· You have unexplained weight loss. °MAKE SURE YOU:  °· Understand these instructions. °· Will watch your condition. °· Will get help right away if you are not doing well or get worse. °  °This information is not intended to replace advice given to you by your health care provider. Make sure you discuss any questions you have with your health care provider. °  °Document Released: 11/29/2003 Document Revised: 03/23/2014 Document Reviewed: 12/12/2012 °Elsevier Interactive Patient Education ©2016 [employer] °Abdominal Pain During Pregnancy °Abdominal pain is common in pregnancy. Most of the time, it does not cause harm. There   are many causes of abdominal pain. Some causes are more serious than others. Some of the causes of abdominal pain in pregnancy are easily diagnosed. Occasionally, the diagnosis takes time to understand. Other times, the cause is not determined. Abdominal pain can be a sign that something is very wrong with the pregnancy, or the pain may have nothing to do with  the pregnancy at all. For this reason, always tell your health care provider if you have any abdominal discomfort. °HOME CARE INSTRUCTIONS  °Monitor your abdominal pain for any changes. The following actions may help to alleviate any discomfort you are experiencing: °· Do not have sexual intercourse or put anything in your vagina until your symptoms go away completely. °· Get plenty of rest until your pain improves. °· Drink clear fluids if you feel nauseous. Avoid solid food as long as you are uncomfortable or nauseous. °· Only take over-the-counter or prescription medicine as directed by your health care provider. °· Keep all follow-up appointments with your health care provider. °SEEK IMMEDIATE MEDICAL CARE IF: °· You are bleeding, leaking fluid, or passing tissue from the vagina. °· You have increasing pain or cramping. °· You have persistent vomiting. °· You have painful or bloody urination. °· You have a fever. °· You notice a decrease in your baby's movements. °· You have extreme weakness or feel faint. °· You have shortness of breath, with or without abdominal pain. °· You develop a severe headache with abdominal pain. °· You have abnormal vaginal discharge with abdominal pain. °· You have persistent diarrhea. °· You have abdominal pain that continues even after rest, or gets worse. °MAKE SURE YOU:  °· Understand these instructions. °· Will watch your condition. °· Will get help right away if you are not doing well or get worse. °  °This information is not intended to replace advice given to you by your health care provider. Make sure you discuss any questions you have with your health care provider. °  °Document Released: 03/02/2005 Document Revised: 12/21/2012 Document Reviewed: 09/29/2012 °Elsevier Interactive Patient Education ©2016 Elsevier Inc. ° °

## 2015-11-07 NOTE — MAU Provider Note (Signed)
History     CSN: [id number]  Arrival date and time: 11/07/15 1546   First Provider Initiated Contact with Patient 11/07/15 1638      Chief Complaint  Patient presents with  . Abdominal Pain  . Vaginal Discharge   G1 @27 .3 weeks c/o constant bilateral LAP x2 hours. She describes the pain as achy and pressure. She did not try any home remedies or OTC meds. She reports +FM and denies VB, LOF, and ctx. She denies urinary sx. No recent IC. She reports having constipation with hard stools and last BM was yesterday.    OB History    Gravida Para Term Preterm AB Living   1 0 0 0 0 0   SAB TAB Ectopic Multiple Live Births   0 0 0 0        Past Medical History:  Diagnosis Date  . Allergy    seasonal  . Eczema 10/22/2014  . Medical history non-contributory   . Obesity 10/22/2014  . Preventative health care 11/04/2014    Past Surgical History:  Procedure Laterality Date  . COLPOSCOPY  2012   abnormal cells, cervix, roughly 2012, normal    Family History  Problem Relation Age of Onset  . Mental illness Father     suicide  . Diabetes Maternal Grandmother   . Asthma Maternal Grandmother   . COPD Paternal Grandmother     recurrent bronchitis  . Arthritis Paternal Grandmother     Social History  Substance Use Topics  . Smoking status: Never Smoker  . Smokeless tobacco: Never Used  . Alcohol use No     Comment: Ocassionally    Allergies: No Known Allergies  Prescriptions Prior to Admission  Medication Sig Dispense Refill Last Dose  . diclofenac sodium (VOLTAREN) 1 % GEL Apply 4 g topically 4 (four) times daily as needed. 100 g 1   . fluticasone (FLONASE) 50 MCG/ACT nasal spray PLACE 2 SPRAYS INTO BOTH NOSTRILS DAILY.  6 Taking  . loratadine (CLARITIN) 10 MG tablet Take 1 tablet (10 mg total) by mouth daily. 30 tablet 11 Taking  . metroNIDAZOLE (FLAGYL) 500 MG tablet TAKE 1 TABLET (500 MG TOTAL) BY MOUTH 2 (TWO) TIMES DAILY.  2 Taking  . Prenat w/o A-FeCbn-Meth-FA-DHA  (PRENATE MINI) 29-0.6-0.4-350 MG CAPS Take 1 capsule by mouth daily before breakfast. 90 capsule 3 Taking  . Prenat-FeCbn-FeAsp-Meth-FA-DHA (PRENATE MINI) 18-0.6-0.4-350 MG CAPS TAKE 1 CAPSULE BY MOUTH DAILY BEFORE BREAKFAST.  3 Taking    Review of Systems  Constitutional: Negative.   Gastrointestinal: Positive for abdominal pain and constipation.  Genitourinary: Negative.   Musculoskeletal: Negative for back pain.   Physical Exam   Blood pressure 116/74, pulse 101, temperature 98.3 F (36.8 C), temperature source Oral, resp. rate 20, last menstrual period 04/29/2015, SpO2 99 %.  Physical Exam  Constitutional: She is oriented to person, place, and time. She appears well-developed and well-nourished.  HENT:  Head: Normocephalic and atraumatic.  Neck: Normal range of motion. Neck supple.  Cardiovascular: Normal rate.   Respiratory: Effort normal.  GI: Soft. She exhibits no distension and no mass. There is no tenderness. There is no rebound and no guarding.  gravid  Genitourinary:  Genitourinary Comments: External: no lesions Vagina: rugated, nulli, moderate thick clumpy discharge SVE: closed/long   Musculoskeletal: Normal range of motion.  Neurological: She is alert and oriented to person, place, and time.  Skin: Skin is warm and dry.  Psychiatric: She has a normal mood and affect.  EFM: 150 bpm, mod variability, + accels, no decels Toco: x1  Results for orders placed or performed during the hospital encounter of 11/07/15 (from the past 24 hour(s))  Urinalysis, Routine w reflex microscopic (not [facility])     Status: Abnormal   Collection Time: 11/07/15  3:55 PM  Result Value Ref Range   Color, Urine YELLOW YELLOW   APPearance HAZY (A) CLEAR   Specific Gravity, Urine 1.025 1.005 - 1.030   pH 6.0 5.0 - 8.0   Glucose, UA NEGATIVE NEGATIVE mg/dL   Hgb urine dipstick NEGATIVE NEGATIVE   Bilirubin Urine NEGATIVE NEGATIVE   Ketones, ur NEGATIVE NEGATIVE mg/dL   Protein, ur  NEGATIVE NEGATIVE mg/dL   Nitrite NEGATIVE NEGATIVE   Leukocytes, UA NEGATIVE NEGATIVE  Wet prep, genital     Status: None   Collection Time: 11/07/15  4:45 PM  Result Value Ref Range   Yeast Wet Prep HPF POC NONE SEEN NONE SEEN   Trich, Wet Prep NONE SEEN NONE SEEN   Clue Cells Wet Prep HPF POC NONE SEEN NONE SEEN   WBC, Wet Prep HPF POC NONE SEEN NONE SEEN   Sperm NONE SEEN     MAU Course  Procedures  MDM Labs ordered and reviewed. No evidence of PTL or UTI. Pain likely d/t constipation. Stable for discharge home.  Assessment and Plan   1. Constipation, unspecified constipation type   2. Second trimester pregnancy   3. Pregnancy related abdominal pain of lower quadrant, antepartum   4.      Reactive NST  Discharge home Increase water to 6 bottles per day and more dietary fiber [facility] PTL precautions  Donette LarryMelanie Yamir Carignan, CNM 11/07/2015, 4:57 PM

## 2015-11-07 NOTE — MAU Note (Signed)
Started having lower abd pressure and cramping for about an hour now.  Denies vaginal bleeding.  Pt states she has had some clear discharge in her panties which was wetter than normal today but no gush of fluid. Baby is moving but not as much as normal.

## 2015-11-07 NOTE — MAU Note (Cosign Needed)
History     CSN: [id number]  Arrival date & time 11/07/15  1546   First Provider Initiated Contact with Patient 11/07/15 1638      Chief Complaint  Patient presents with  . Abdominal Pain  . Vaginal Discharge    HPI   Patient presents with constant abdominal pain that started today at 1400. She describes it as pressure and cramping, rating the pain a 5-6. She has not taken any acetaminophen or other medication for this pain. She does think moving around exacerbates the pain, but she has not found any alleviating factors. Patient denies any fever/chills, vaginal bleeding/discharge, dysuria, urinary frequency/urgency, back pain, contractions, RUQ pain, headache, changes in vision, facial swelling, or nausea/vomiting. She reports that she drinks about four 20-ounce bottles of water daily but says she is trying to stay more hydrated. Patient's most recent bowel movement was yesterday but it was hard stool, and she has yet to go today.  Patient [facility]. To her knowledge her last ultrasound was normal. She reports an uncomplicated pregnancy thus far, and patient has no chronic medical problems.   Past Medical History:  Diagnosis Date  . Allergy    seasonal  . Eczema 10/22/2014  . Medical history non-contributory   . Obesity 10/22/2014  . Preventative health care 11/04/2014    Past Surgical History:  Procedure Laterality Date  . COLPOSCOPY  2012   abnormal cells, cervix, roughly 2012, normal    Family History  Problem Relation Age of Onset  . Mental illness Father     suicide  . Diabetes Maternal Grandmother   . Asthma Maternal Grandmother   . COPD Paternal Grandmother     recurrent bronchitis  . Arthritis Paternal Grandmother     Social History  Substance Use Topics  . Smoking status: Never Smoker  . Smokeless tobacco: Never Used  . Alcohol use No     Comment: Ocassionally    OB History    Gravida Para Term  Preterm AB Living   1 0 0 0 0 0   SAB TAB Ectopic Multiple Live Births   0 0 0 0        Review of Systems  Constitutional: Negative for chills, fatigue and fever.  Eyes: Negative for visual disturbance.  Respiratory: Negative for shortness of breath.   Cardiovascular: Negative for chest pain and palpitations.  Gastrointestinal: Positive for abdominal pain and constipation (reports hard stool). Negative for nausea and vomiting.  Genitourinary: Negative for dysuria, flank pain, frequency, urgency, vaginal bleeding and vaginal discharge.  Neurological: Negative for dizziness and weakness.    Allergies  Review of patient's allergies indicates no known allergies.  Home Medications    BP 116/74 (BP Location: Right Arm)   Pulse 101   Temp 98.3 F (36.8 C) (Oral)   Resp 20   LMP 04/29/2015   SpO2 99%   Physical Exam  Constitutional: She is oriented to person, place, and time. She appears well-developed and well-nourished. No distress.  Cardiovascular: Normal rate.   Abdominal: There is tenderness (pressure in the lower abdomen bilaterally).  Genitourinary: Cervix exhibits no discharge and no friability. Vaginal discharge (thick white-yellow discharge) found.  Neurological: She is alert and oriented to person, place, and time.  Psychiatric: She has a normal mood and affect. Her behavior is normal.    MAU Course  Procedures (including critical care time)  Labs Reviewed  URINALYSIS, ROUTINE W REFLEX MICROSCOPIC (NOT AT  [facility]) - Abnormal; Notable for the following:       Result Value   APPearance HAZY (*)    All other components within normal limits  WET PREP, GENITAL  GC/CHLAMYDIA PROBE AMP ([place]) NOT AT [facility]   No results found.   1. Constipation, unspecified constipation type   2. Second trimester pregnancy   3. Pregnancy related abdominal pain of lower quadrant, antepartum       MDM  Patient presents with lower abdominal cramping and pain. Differential  includes placental abruption, preeclampsia, uterine abruption, infection (UTI or STD), dehydration, constipation, and round ligament pain.  Patient denies any vaginal bleeding/discharge, and no bleeding was visualized on pelvic exam, which makes placental and uterine abruption less likely. Fetal heart rates are within normal limits which makes intra-amniotic infection less likely. Patient is not hypertensive so she is unlikely to be preeclamptic (also she denies RUQ pain, headache, vision changes, etc.). Patient was negative for nitrites/Leukocyte esterase, and she denies any urinary urgency, frequency, and dysuria so UTI is unlikely. A sample taken of vaginal discharge found on exam was found to be negative for clue cells, trichomonas, and yeast. Patient reports having low fluid intake which might be causing uterine cramping. Her urinalysis showed a specific gravity of 1.025 so she is likely slightly dehydrated. She also reports having hard stools, so constipation (potentially exacerbated by dehydration) is likely also contributing. Round ligament pain could be contributing, but this is usually more right sided, occurs upon waking, and is self limiting.  1. Urinalysis to assess for dehydration and possible UTI. 2. GC/chlamydia for possible ST infection. 2. Wet prep: patient's vaginal discharge did not show yeast/trichomonas/clue cells. Possibly an inadequate sample and given the amount and its thick nature, will empirically treat with miconazole cream as fluconizole is currently not safe in pregnancy. 4. Likely this is caused by uterine cramping from dehydration, or constipation (also worsened by dehydration). Encourage patient to increase oral fluid intake, and will treat with Colace stool softener.   Hal Morales MS This is a [employer] manager- Please see provider note in chart.

## 2015-11-08 LAB — GC/CHLAMYDIA PROBE AMP (~~LOC~~) NOT AT ARMC
Chlamydia: NEGATIVE
Neisseria Gonorrhea: NEGATIVE

## 2015-11-14 ENCOUNTER — Encounter: Payer: PRIVATE HEALTH INSURANCE | Admitting: Obstetrics

## 2015-11-14 ENCOUNTER — Encounter: Payer: Self-pay | Admitting: Obstetrics

## 2015-11-14 ENCOUNTER — Other Ambulatory Visit: Payer: PRIVATE HEALTH INSURANCE

## 2015-11-14 ENCOUNTER — Ambulatory Visit (INDEPENDENT_AMBULATORY_CARE_PROVIDER_SITE_OTHER): Payer: PRIVATE HEALTH INSURANCE | Admitting: Obstetrics

## 2015-11-14 VITALS — BP 100/68 | HR 93 | Wt 253.0 lb

## 2015-11-14 DIAGNOSIS — Z3403 Encounter for supervision of normal first pregnancy, third trimester: Secondary | ICD-10-CM

## 2015-11-14 LAB — POCT URINALYSIS DIPSTICK
Bilirubin, UA: NEGATIVE
GLUCOSE UA: NEGATIVE
Ketones, UA: NEGATIVE
LEUKOCYTES UA: NEGATIVE
NITRITE UA: NEGATIVE
Protein, UA: NEGATIVE
RBC UA: NEGATIVE
Spec Grav, UA: 1.01
UROBILINOGEN UA: NEGATIVE
pH, UA: 7

## 2015-11-14 NOTE — Progress Notes (Signed)
Subjective:    Krista MilroyShamayia E Merritt is a 25 y.o. female being seen today for her obstetrical visit. She is at 6030w3d gestation. Patient reports no complaints. Fetal movement: normal.  Problem List Items Addressed This Visit    None    Visit Diagnoses    Encounter for supervision of normal first pregnancy in third trimester    -  Primary   Relevant Orders   POCT urinalysis dipstick (Completed)   Glucose Tolerance, 2 Hours w/1 Hour   CBC   HIV antibody   RPR     Patient Active Problem List   Diagnosis Date Noted  . Right leg pain 11/05/2015  . Acute pharyngitis 01/07/2015  . Allergic rhinitis 01/07/2015  . Preventative health care 11/04/2014  . Allergy   . Eczema 10/22/2014  . Obesity 10/22/2014  . BV (bacterial vaginosis) 06/13/2013   Objective:    BP 100/68   Pulse 93   Wt 253 lb (114.8 kg)   LMP 04/29/2015   BMI 42.10 kg/m  FHT:  150 BPM  Uterine Size: size equals dates  Presentation: unsure     Assessment:    Pregnancy @ 8130w3d weeks   Plan:     labs reviewed, problem list updated Consent signed. GBS sent TDAP offered  Rhogam given for RH negative Pediatrician: discussed. Infant feeding: plans to breastfeed. Maternity leave: discussed. Cigarette smoking: never smoked. Orders Placed This Encounter  Procedures  . Glucose Tolerance, 2 Hours w/1 Hour  . CBC  . HIV antibody  . RPR  . POCT urinalysis dipstick   No orders of the defined types were placed in this encounter.  Follow up in 2 Weeks.

## 2015-11-15 LAB — GLUCOSE TOLERANCE, 2 HOURS W/ 1HR
GLUCOSE, 1 HOUR: 141 mg/dL (ref 65–179)
GLUCOSE, 2 HOUR: 110 mg/dL (ref 65–152)
GLUCOSE, FASTING: 82 mg/dL (ref 65–91)

## 2015-11-15 LAB — RPR: RPR Ser Ql: NONREACTIVE

## 2015-11-15 LAB — CBC
Hematocrit: 34.1 % (ref 34.0–46.6)
Hemoglobin: 10.6 g/dL — ABNORMAL LOW (ref 11.1–15.9)
MCH: 21.9 pg — AB (ref 26.6–33.0)
MCHC: 31.1 g/dL — ABNORMAL LOW (ref 31.5–35.7)
MCV: 70 fL — ABNORMAL LOW (ref 79–97)
PLATELETS: 268 10*3/uL (ref 150–379)
RBC: 4.85 x10E6/uL (ref 3.77–5.28)
RDW: 14.9 % (ref 12.3–15.4)
WBC: 7.5 10*3/uL (ref 3.4–10.8)

## 2015-11-15 LAB — HIV ANTIBODY (ROUTINE TESTING W REFLEX): HIV SCREEN 4TH GENERATION: NONREACTIVE

## 2015-11-24 ENCOUNTER — Inpatient Hospital Stay (HOSPITAL_COMMUNITY)
Admission: AD | Admit: 2015-11-24 | Discharge: 2015-11-24 | Disposition: A | Discharge status: Home / Self Care | Payer: No Typology Code available for payment source | Source: Ambulatory Visit | Attending: Obstetrics and Gynecology | Admitting: Obstetrics and Gynecology

## 2015-11-24 ENCOUNTER — Encounter (HOSPITAL_COMMUNITY): Payer: Self-pay | Admitting: *Deleted

## 2015-11-24 DIAGNOSIS — T1490 Injury, unspecified: Secondary | ICD-10-CM | POA: Diagnosis present

## 2015-11-24 DIAGNOSIS — O9A213 Injury, poisoning and certain other consequences of external causes complicating pregnancy, third trimester: Secondary | ICD-10-CM | POA: Diagnosis present

## 2015-11-24 DIAGNOSIS — Z3A29 29 weeks gestation of pregnancy: Secondary | ICD-10-CM | POA: Diagnosis present

## 2015-11-24 DIAGNOSIS — S3991XA Unspecified injury of abdomen, initial encounter: Secondary | ICD-10-CM | POA: Diagnosis not present

## 2015-11-24 DIAGNOSIS — O26893 Other specified pregnancy related conditions, third trimester: Secondary | ICD-10-CM | POA: Diagnosis not present

## 2015-11-24 DIAGNOSIS — W1830XA Fall on same level, unspecified, initial encounter: Secondary | ICD-10-CM | POA: Insufficient documentation

## 2015-11-24 DIAGNOSIS — W102XXA Fall (on)(from) incline, initial encounter: Secondary | ICD-10-CM

## 2015-11-24 DIAGNOSIS — R109 Unspecified abdominal pain: Secondary | ICD-10-CM | POA: Insufficient documentation

## 2015-11-24 NOTE — Progress Notes (Signed)
Provider notified of pt arrival to MAU.  Notified that pt fell when leaving church around 1230 and is unsure if she landed more on her knee or her belly.  Notified that pt states she is feeling the baby move less but is currently on the monitor.  Provider states she will come see the pt.

## 2015-11-24 NOTE — MAU Note (Signed)
Urine sent to lab 

## 2015-11-24 NOTE — Discharge Instructions (Signed)
Fall Prevention in the Home   Falls can cause injuries. They can happen to people of all ages. There are many things you can do to make your home safe and to help prevent falls.   WHAT CAN I DO ON THE OUTSIDE OF MY HOME?  · Regularly fix the edges of walkways and driveways and fix any cracks.  · Remove anything that might make you trip as you walk through a door, such as a raised step or threshold.  · Trim any bushes or trees on the path to your home.  · Use bright outdoor lighting.  · Clear any walking paths of anything that might make someone trip, such as rocks or tools.  · Regularly check to see if handrails are loose or broken. Make sure that both sides of any steps have handrails.  · Any raised decks and porches should have guardrails on the edges.  · Have any leaves, snow, or ice cleared regularly.  · Use sand or salt on walking paths during winter.  · Clean up any spills in your garage right away. This includes oil or grease spills.  WHAT CAN I DO IN THE BATHROOM?   · Use night lights.  · Install grab bars by the toilet and in the tub and shower. Do not use towel bars as grab bars.  · Use non-skid mats or decals in the tub or shower.  · If you need to sit down in the shower, use a plastic, non-slip stool.  · Keep the floor dry. Clean up any water that spills on the floor as soon as it happens.  · Remove soap buildup in the tub or shower regularly.  · Attach bath mats securely with double-sided non-slip rug tape.  · Do not have throw rugs and other things on the floor that can make you trip.  WHAT CAN I DO IN THE BEDROOM?  · Use night lights.  · Make sure that you have a light by your bed that is easy to reach.  · Do not use any sheets or blankets that are too big for your bed. They should not hang down onto the floor.  · Have a firm chair that has side arms. You can use this for support while you get dressed.  · Do not have throw rugs and other things on the floor that can make you trip.  WHAT CAN I DO IN  THE KITCHEN?  · Clean up any spills right away.  · Avoid walking on wet floors.  · Keep items that you use a lot in easy-to-reach places.  · If you need to reach something above you, use a strong step stool that has a grab bar.  · Keep electrical cords out of the way.  · Do not use floor polish or wax that makes floors slippery. If you must use wax, use non-skid floor wax.  · Do not have throw rugs and other things on the floor that can make you trip.  WHAT CAN I DO WITH MY STAIRS?  · Do not leave any items on the stairs.  · Make sure that there are handrails on both sides of the stairs and use them. Fix handrails that are broken or loose. Make sure that handrails are as long as the stairways.  · Check any carpeting to make sure that it is firmly attached to the stairs. Fix any carpet that is loose or worn.  · Avoid having throw rugs at the top   or bottom of the stairs. If you do have throw rugs, attach them to the floor with carpet tape.  · Make sure that you have a light switch at the top of the stairs and the bottom of the stairs. If you do not have them, ask someone to add them for you.  WHAT ELSE CAN I DO TO HELP PREVENT FALLS?  · Wear shoes that:    Do not have high heels.    Have rubber bottoms.    Are comfortable and fit you well.    Are closed at the toe. Do not wear sandals.  · If you use a stepladder:    Make sure that it is fully opened. Do not climb a closed stepladder.    Make sure that both sides of the stepladder are locked into place.    Ask someone to hold it for you, if possible.  · Clearly mark and make sure that you can see:    Any grab bars or handrails.    First and last steps.    Where the edge of each step is.  · Use tools that help you move around (mobility aids) if they are needed. These include:    Canes.    Walkers.    Scooters.    Crutches.  · Turn on the lights when you go into a dark area. Replace any light bulbs as soon as they burn out.  · Set up your furniture so you have a clear  path. Avoid moving your furniture around.  · If any of your floors are uneven, fix them.  · If there are any pets around you, be aware of where they are.  · Review your medicines with your doctor. Some medicines can make you feel dizzy. This can increase your chance of falling.  Ask your doctor what other things that you can do to help prevent falls.     This information is not intended to replace advice given to you by your health care provider. Make sure you discuss any questions you have with your health care provider.     Document Released: 12/27/2008 Document Revised: 07/17/2014 Document Reviewed: 04/06/2014  Elsevier Interactive Patient Education ©2016 [employer]

## 2015-11-24 NOTE — MAU Provider Note (Signed)
History   Krista Merritt is a 25 yo, african [place], G1 @ 29.6 wks who presents to MAU after falling at church. Pt states she remembers falling on hers knees, not sure if she fell on her abd, however her mother states she was on her abd when she assisted her to her feet. She states she started having lower abd pressure when she arrived home. Denies any LOF or VB.  CSN: [id number]  Arrival date and time: 11/24/15 1329   None     No chief complaint on file.  HPI  OB History    Gravida Para Term Preterm AB Living   1 0 0 0 0 0   SAB TAB Ectopic Multiple Live Births   0 0 0 0        Past Medical History:  Diagnosis Date  . Allergy    seasonal  . Eczema 10/22/2014  . Medical history non-contributory   . Obesity 10/22/2014  . Preventative health care 11/04/2014    Past Surgical History:  Procedure Laterality Date  . COLPOSCOPY  2012   abnormal cells, cervix, roughly 2012, normal    Family History  Problem Relation Age of Onset  . Mental illness Father     suicide  . Diabetes Maternal Grandmother   . Asthma Maternal Grandmother   . COPD Paternal Grandmother     recurrent bronchitis  . Arthritis Paternal Grandmother     Social History  Substance Use Topics  . Smoking status: Never Smoker  . Smokeless tobacco: Never Used  . Alcohol use No     Comment: Ocassionally    Allergies: No Known Allergies  Prescriptions Prior to Admission  Medication Sig Dispense Refill Last Dose  . diclofenac sodium (VOLTAREN) 1 % GEL Apply 4 g topically 4 (four) times daily as needed. (Patient not taking: Reported on 11/14/2015) 100 g 1 Not Taking  . fluticasone (FLONASE) 50 MCG/ACT nasal spray PLACE 2 SPRAYS INTO BOTH NOSTRILS DAILY.  6 Not Taking  . loratadine (CLARITIN) 10 MG tablet Take 1 tablet (10 mg total) by mouth daily. (Patient not taking: Reported on 11/14/2015) 30 tablet 11 Not Taking  . Prenat w/o A-FeCbn-Meth-FA-DHA (PRENATE MINI) 29-0.6-0.4-350 MG CAPS Take 1 capsule by mouth  daily before breakfast. 90 capsule 3 Taking    Review of Systems  Constitutional: Negative.   HENT: Negative.   Eyes: Negative.   Respiratory: Negative.   Cardiovascular: Negative.   Gastrointestinal: Positive for abdominal pain.  Genitourinary: Negative.   Musculoskeletal: Negative.   Skin: Negative.   Neurological: Negative.   Endo/Heme/Allergies: Negative.   Psychiatric/Behavioral: Negative.    Physical Exam   Blood pressure 105/63, pulse 98, temperature 97.6 F (36.4 C), temperature source Oral, resp. rate 16, last menstrual period 04/29/2015.  Physical Exam  Constitutional: She is oriented to person, place, and time. She appears well-developed and well-nourished.  HENT:  Head: Normocephalic.  Eyes: Conjunctivae are normal. Pupils are equal, round, and reactive to light.  Neck: Normal range of motion.  Cardiovascular: Normal rate and regular rhythm.   Respiratory: Effort normal and breath sounds normal.  GI: Soft. Bowel sounds are normal.  Genitourinary: Vagina normal and uterus normal.  Musculoskeletal: Normal range of motion.  Neurological: She is alert and oriented to person, place, and time. She has normal reflexes.  Skin: Skin is warm and dry.  Psychiatric: She has a normal mood and affect. Her behavior is normal. Judgment and thought content normal.    MAU Course  Procedures  MDM  No evidence of placental abruption. Pain likely d/t jarring motion when falling. Stable for discharge home.  Assessment and Plan  1. Post Fall 2. Third trimester pregnancy 3. Abd pain in pregnancy  4. Monitor x 4 hrs, reactive   Discharge home   [name] 11/24/2015, 2:11 PM

## 2015-11-24 NOTE — MAU Note (Signed)
Pt states that she fell around 1230.  Pt states that she think when she fell on the cement it was mostly on her knee but she was on her belly when she was helped up.  Pt states she is feeling some lower abdominal pain and some pressure.

## 2015-11-26 ENCOUNTER — Ambulatory Visit (INDEPENDENT_AMBULATORY_CARE_PROVIDER_SITE_OTHER): Payer: PRIVATE HEALTH INSURANCE | Admitting: Obstetrics

## 2015-11-26 ENCOUNTER — Encounter: Payer: Self-pay | Admitting: Obstetrics

## 2015-11-26 ENCOUNTER — Encounter: Payer: PRIVATE HEALTH INSURANCE | Admitting: Obstetrics

## 2015-11-26 VITALS — BP 118/80 | HR 99 | Temp 98.2°F | Wt 256.9 lb

## 2015-11-26 DIAGNOSIS — Z3403 Encounter for supervision of normal first pregnancy, third trimester: Secondary | ICD-10-CM

## 2015-11-26 NOTE — Progress Notes (Signed)
Subjective:    Krista Merritt is a 25 y.o. female being seen today for her obstetrical visit. She is at 3844w1d gestation. Patient reports no complaints. Fetal movement: normal.  Problem List Items Addressed This Visit    None    Visit Diagnoses   None.    Patient Active Problem List   Diagnosis Date Noted  . Right leg pain 11/05/2015  . Acute pharyngitis 01/07/2015  . Allergic rhinitis 01/07/2015  . Preventative health care 11/04/2014  . Allergy   . Eczema 10/22/2014  . Obesity 10/22/2014  . BV (bacterial vaginosis) 06/13/2013   Objective:    BP 118/80   Pulse 99   Temp 98.2 F (36.8 C)   Wt 256 lb 14.4 oz (116.5 kg)   LMP 04/29/2015   BMI 42.75 kg/m  FHT:  150 BPM  Uterine Size: size equals dates  Presentation: unsure     Assessment:    Pregnancy @ 6144w1d weeks   Plan:     labs reviewed, problem list updated Consent signed. GBS sent TDAP offered  Rhogam given for RH negative Pediatrician: discussed. Infant feeding: plans to breastfeed. Maternity leave: discussed. Cigarette smoking: never smoked. No orders of the defined types were placed in this encounter.  No orders of the defined types were placed in this encounter.  Follow up in 2 Weeks.

## 2015-12-10 ENCOUNTER — Encounter: Payer: Self-pay | Admitting: Obstetrics

## 2015-12-10 ENCOUNTER — Ambulatory Visit (INDEPENDENT_AMBULATORY_CARE_PROVIDER_SITE_OTHER): Payer: PRIVATE HEALTH INSURANCE | Admitting: Obstetrics

## 2015-12-10 VITALS — BP 108/71 | HR 102 | Temp 97.9°F | Wt 257.0 lb

## 2015-12-10 DIAGNOSIS — Z23 Encounter for immunization: Secondary | ICD-10-CM

## 2015-12-10 DIAGNOSIS — Z3493 Encounter for supervision of normal pregnancy, unspecified, third trimester: Secondary | ICD-10-CM

## 2015-12-10 NOTE — Progress Notes (Signed)
Subjective:    Krista MilroyShamayia E Merritt is a 25 y.o. female being seen today for her obstetrical visit. She is at 3634w1d gestation. Patient reports no complaints. Fetal movement: normal.  Problem List Items Addressed This Visit    None    Visit Diagnoses    Encounter for immunization       Relevant Orders   Flu Vaccine QUAD 36+ mos IM (Completed)     Patient Active Problem List   Diagnosis Date Noted  . Right leg pain 11/05/2015  . Acute pharyngitis 01/07/2015  . Allergic rhinitis 01/07/2015  . Preventative health care 11/04/2014  . Allergy   . Eczema 10/22/2014  . Obesity 10/22/2014  . BV (bacterial vaginosis) 06/13/2013   Objective:    BP 108/71   Pulse (!) 102   Temp 97.9 F (36.6 C)   Wt 257 lb (116.6 kg)   LMP 04/29/2015   BMI 42.77 kg/m  FHT:  150 BPM  Uterine Size: size equals dates  Presentation: unsure     Assessment:    Pregnancy @ 7934w1d weeks   Plan:     labs reviewed, problem list updated Consent signed. GBS sent TDAP offered  Rhogam given for RH negative Pediatrician: discussed. Infant feeding: plans to breastfeed. Maternity leave: discussed. Cigarette smoking: never smoked. Orders Placed This Encounter  Procedures  . Flu Vaccine QUAD 36+ mos IM   No orders of the defined types were placed in this encounter.  Follow up in 2 Weeks.   Patient ID: Krista MilroyShamayia E Merritt, female   DOB: Mar 05, 1991, 25 y.o.   MRN: [id number]

## 2015-12-10 NOTE — Progress Notes (Signed)
Patient wants to know how much her baby weighs.

## 2015-12-25 ENCOUNTER — Ambulatory Visit (INDEPENDENT_AMBULATORY_CARE_PROVIDER_SITE_OTHER): Payer: No Typology Code available for payment source | Admitting: Obstetrics and Gynecology

## 2015-12-25 DIAGNOSIS — Z3493 Encounter for supervision of normal pregnancy, unspecified, third trimester: Secondary | ICD-10-CM

## 2015-12-25 DIAGNOSIS — Z3483 Encounter for supervision of other normal pregnancy, third trimester: Secondary | ICD-10-CM | POA: Diagnosis not present

## 2015-12-25 DIAGNOSIS — Z3490 Encounter for supervision of normal pregnancy, unspecified, unspecified trimester: Secondary | ICD-10-CM | POA: Insufficient documentation

## 2015-12-25 NOTE — Progress Notes (Signed)
Subjective:  Krista MilroyShamayia E Gries is a 25 y.o. G1P0000 at 6268w2d being seen today for ongoing prenatal care.  She is currently monitored for the following issues for this low-risk pregnancy and has Eczema; Obesity; Allergy; Preventative health care; Allergic rhinitis; and Supervision of low-risk pregnancy on her problem list.  Patient reports no complaints.  Contractions: Not present. Vag. Bleeding: None.  Movement: Present. Denies leaking of fluid.   The following portions of the patient's history were reviewed and updated as appropriate: allergies, current medications, past family history, past medical history, past social history, past surgical history and problem list. Problem list updated.  Objective:   Vitals:   12/25/15 1133  BP: 113/72  Pulse: (!) 101  Weight: 260 lb (117.9 kg)    Fetal Status:     Movement: Present     General:  Alert, oriented and cooperative. Patient is in no acute distress.  Skin: Skin is warm and dry. No rash noted.   Cardiovascular: Normal heart rate noted  Respiratory: Normal respiratory effort, no problems with respiration noted  Abdomen: Soft, gravid, appropriate for gestational age. Pain/Pressure: Absent     Pelvic:  Cervical exam deferred        Extremities: Normal range of motion.  Edema: Moderate pitting, indentation subsides rapidly  Mental Status: Normal mood and affect. Normal behavior. Normal judgment and thought content.   Urinalysis: Urine Protein: Trace Urine Glucose: Negative  Assessment and Plan:  Pregnancy: G1P0000 at 3168w2d  1. Encounter for supervision of low-risk pregnancy in third trimester Pt encouraged to increase water intake and decrease activity  Preterm labor symptoms and general obstetric precautions including but not limited to vaginal bleeding, contractions, leaking of fluid and fetal movement were reviewed in detail with the patient. Please refer to After Visit Summary for other counseling recommendations.  No Follow-up  on file.   Hermina StaggersMichael L Quinnlyn Hearns, MD

## 2016-01-08 ENCOUNTER — Ambulatory Visit (INDEPENDENT_AMBULATORY_CARE_PROVIDER_SITE_OTHER): Payer: PRIVATE HEALTH INSURANCE | Admitting: Obstetrics and Gynecology

## 2016-01-08 DIAGNOSIS — Z3493 Encounter for supervision of normal pregnancy, unspecified, third trimester: Secondary | ICD-10-CM

## 2016-01-08 DIAGNOSIS — Z3483 Encounter for supervision of other normal pregnancy, third trimester: Secondary | ICD-10-CM

## 2016-01-08 NOTE — Patient Instructions (Signed)
Vaginal Delivery °During delivery, your health care provider will help you give birth to your baby. During a vaginal delivery, you will work to push the baby out of your vagina. However, before you can push your baby out, a few things need to happen. The opening of your uterus (cervix) has to soften, thin out, and open up (dilate) all the way to 10 cm. Also, your baby has to move down from the uterus into your vagina.  °SIGNS OF LABOR  °Your health care provider will first need to make sure you are in labor. Signs of labor include:  °· Passing what is called the mucous plug before labor begins. This is a small amount of blood-stained mucus. °· Having regular, painful uterine contractions.   °· The time between contractions gets shorter.   °· The discomfort and pain gradually get more intense. °· Contraction pains get worse when walking and do not go away when resting.   °· Your cervix becomes thinner (effacement) and dilates. °BEFORE THE DELIVERY °Once you are in labor and admitted into the hospital or care center, your health care provider may do the following:  °· Perform a complete physical exam. °· Review any complications related to pregnancy or labor.  °· Check your blood pressure, pulse, temperature, and heart rate (vital signs).   °· Determine if, and when, the rupture of amniotic membranes occurred. °· Do a vaginal exam (using a sterile glove and lubricant) to determine:   °¨ The position (presentation) of the baby. Is the baby's head presenting first (vertex) in the birth canal (vagina), or are the feet or buttocks first (breech)?   °¨ The level (station) of the baby's head within the birth canal.   °¨ The effacement and dilatation of the cervix.   °· An electronic fetal monitor is usually placed on your abdomen when you first arrive. This is used to monitor your contractions and the baby's heart rate. °¨ When the monitor is on your abdomen (external fetal monitor), it can only pick up the frequency and  length of your contractions. It cannot tell the strength of your contractions. °¨ If it becomes necessary for your health care provider to know exactly how strong your contractions are or to see exactly what the baby's heart rate is doing, an internal monitor may be inserted into your vagina and uterus. Your health care provider will discuss the benefits and risks of using an internal monitor and obtain your permission before inserting the device. °¨ Continuous fetal monitoring may be needed if you have an epidural, are receiving certain medicines (such as oxytocin), or have pregnancy or labor complications. °· An IV access tube may be placed into a vein in your arm to deliver fluids and medicines if necessary. °THREE STAGES OF LABOR AND DELIVERY °Normal labor and delivery is divided into three stages. °First Stage °This stage starts when you begin to contract regularly and your cervix begins to efface and dilate. It ends when your cervix is completely open (fully dilated). The first stage is the longest stage of labor and can last from 3 hours to 15 hours.  °Several methods are available to help with labor pain. You and your health care provider will decide which option is best for you. Options include:  °· Opioid medicines. These are strong pain medicines that you can get through your IV tube or as a shot into your muscle. These medicines lessen pain but do not make it go away completely.  °· Epidural. A medicine is given through a thin tube that   is inserted in your back. The medicine numbs the lower part of your body and prevents any pain in that area. °· Paracervical pain medicine. This is an injection of an anesthetic on each side of your cervix.   °· You may request natural childbirth, which does not involve the use of pain medicines or an epidural during labor and delivery. Instead, you will use other things, such as breathing exercises, to help cope with the pain. °Second Stage °The second stage of labor  begins when your cervix is fully dilated at 10 cm. It continues until you push your baby down through the birth canal and the baby is born. This stage can take only minutes or several hours. °· The location of your baby's head as it moves through the birth canal is reported as a number called a station. If the baby's head has not started its descent, the station is described as being at minus 3 (-3). When your baby's head is at the zero station, it is at the middle of the birth canal and is engaged in the pelvis. The station of your baby helps indicate the progress of the second stage of labor. °· When your baby is born, your health care provider may hold the baby with his or her head lowered to prevent amniotic fluid, mucus, and blood from getting into the baby's lungs. The baby's mouth and nose may be suctioned with a small bulb syringe to remove any additional fluid. °· Your health care provider may then place the baby on your stomach. It is important to keep the baby from getting cold. To do this, the health care provider will dry the baby off, place the baby directly on your skin (with no blankets between you and the baby), and cover the baby with warm, dry blankets.   °· The umbilical cord is cut. °Third Stage °During the third stage of labor, your health care provider will deliver the placenta (afterbirth) and make sure your bleeding is under control. The delivery of the placenta usually takes about 5 minutes but can take up to 30 minutes. After the placenta is delivered, a medicine may be given either by IV or injection to help contract the uterus and control bleeding. If you are planning to breastfeed, you can try to do so now. °After you deliver the placenta, your uterus should contract and get very firm. If your uterus does not remain firm, your health care provider will massage it. This is important because the contraction of the uterus helps cut off bleeding at the site where the placenta was attached  to your uterus. If your uterus does not contract properly and stay firm, you may continue to bleed heavily. If there is a lot of bleeding, medicines may be given to contract the uterus and stop the bleeding.  °  °This information is not intended to replace advice given to you by your health care provider. Make sure you discuss any questions you have with your health care provider. °  °Document Released: 12/10/2007 Document Revised: 03/23/2014 Document Reviewed: 10/28/2011 °Elsevier Interactive Patient Education ©2016 Elsevier Inc. ° °

## 2016-01-08 NOTE — Addendum Note (Signed)
Addended by: Francene FindersJAMES, Cia Garretson C on: 01/08/2016 11:48 AM   Modules accepted: Orders

## 2016-01-08 NOTE — Assessment & Plan Note (Signed)
..   Clinic [facility] Prenatal Labs  Dating  Blood type: A/Positive/-- (04/27 1342)   Genetic Screen 1 Screen:    AFP:     Quad:     NIPS: Antibody:Negative (04/27 1342)  Anatomic US  Rubella: 9.35 (04/27 1342)  GTT Early:               Third trimester:  RPR: Non Reactive (08/31 1210)   Flu vaccine 12-10-15 HBsAg: Negative (04/27 1342)   TDaP vaccine   Declined                                            Rhogam: HIV: Non Reactive (08/31 1210)   Baby Food   Breast/Bottle                                      GBS: (For PCN allergy, check sensitivities)  Contraception undecided Pap:  Circumcision Yes- [facility]   Pediatrician undecided   Support Person mother

## 2016-01-08 NOTE — Progress Notes (Signed)
Subjective:  Krista MilroyShamayia E Merritt is a 25 y.o. G1P0000 at 3838w2d being seen today for ongoing prenatal care.  She is currently monitored for the following issues for this low-risk pregnancy and has Eczema; Obesity; Allergy; Preventative health care; Allergic rhinitis; and Supervision of low-risk pregnancy on her problem list.  Patient reports no complaints.  Contractions: Not present. Vag. Bleeding: None.  Movement: Present. Denies leaking of fluid.   The following portions of the patient's history were reviewed and updated as appropriate: allergies, current medications, past family history, past medical history, past social history, past surgical history and problem list. Problem list updated.  Objective:   Vitals:   01/08/16 1121  BP: 120/78  Pulse: 91  Temp: 98.5 F (36.9 C)  Weight: 266 lb 12.8 oz (121 kg)    Fetal Status: Fetal Heart Rate (bpm): 147   Movement: Present     General:  Alert, oriented and cooperative. Patient is in no acute distress.  Skin: Skin is warm and dry. No rash noted.   Cardiovascular: Normal heart rate noted  Respiratory: Normal respiratory effort, no problems with respiration noted  Abdomen: Soft, gravid, appropriate for gestational age. Pain/Pressure: Absent     Pelvic:  Cervical exam performed        Extremities: Normal range of motion.  Edema: Mild pitting, slight indentation  Mental Status: Normal mood and affect. Normal behavior. Normal judgment and thought content.   Urinalysis:      Assessment and Plan:  Pregnancy: G1P0000 at 2738w2d  1. Encounter for supervision of low-risk pregnancy in third trimester GBS obtained  Term labor symptoms and general obstetric precautions including but not limited to vaginal bleeding, contractions, leaking of fluid and fetal movement were reviewed in detail with the patient. Please refer to After Visit Summary for other counseling recommendations.  Return in about 1 week (around 01/15/2016) for OB  visit.   Hermina StaggersMichael L Jacorie Ernsberger, MD

## 2016-01-08 NOTE — Progress Notes (Signed)
Patient is in the office, states feeling good and reports good fetal movement. 

## 2016-01-10 LAB — STREP GP B NAA: STREP GROUP B AG: POSITIVE — AB

## 2016-01-16 ENCOUNTER — Encounter: Payer: Self-pay | Admitting: Advanced Practice Midwife

## 2016-01-16 ENCOUNTER — Ambulatory Visit (INDEPENDENT_AMBULATORY_CARE_PROVIDER_SITE_OTHER): Payer: PRIVATE HEALTH INSURANCE | Admitting: Advanced Practice Midwife

## 2016-01-16 VITALS — BP 115/81 | HR 106 | Wt 270.0 lb

## 2016-01-16 DIAGNOSIS — Z3490 Encounter for supervision of normal pregnancy, unspecified, unspecified trimester: Secondary | ICD-10-CM

## 2016-01-16 DIAGNOSIS — Z3483 Encounter for supervision of other normal pregnancy, third trimester: Secondary | ICD-10-CM | POA: Diagnosis not present

## 2016-01-16 DIAGNOSIS — O99820 Streptococcus B carrier state complicating pregnancy: Secondary | ICD-10-CM | POA: Insufficient documentation

## 2016-01-16 DIAGNOSIS — Z3493 Encounter for supervision of normal pregnancy, unspecified, third trimester: Secondary | ICD-10-CM

## 2016-01-16 NOTE — Progress Notes (Signed)
   PRENATAL VISIT NOTE  Subjective:  Krista Merritt is a 25 y.o. G1P0000 at 7750w3d being seen today for ongoing prenatal care.  She is currently monitored for the following issues for this low-risk pregnancy and has Eczema; Obesity; Allergy; Preventative health care; Allergic rhinitis; Supervision of low-risk pregnancy; and Group B Streptococcus carrier, antepartum on her problem list.  Patient reports no complaints.  Contractions: Irregular.  .  Movement: Present. Denies leaking of fluid.   The following portions of the patient's history were reviewed and updated as appropriate: allergies, current medications, past family history, past medical history, past social history, past surgical history and problem list. Problem list updated.  Objective:   Vitals:   01/16/16 1328  BP: 115/81  Pulse: (!) 106  Weight: 270 lb (122.5 kg)    Fetal Status: Fetal Heart Rate (bpm): 149 Fundal Height: 38 cm Movement: Present  Presentation: Vertex  General:  Alert, oriented and cooperative. Patient is in no acute distress.  Skin: Skin is warm and dry. No rash noted.   Cardiovascular: Normal heart rate noted  Respiratory: Normal respiratory effort, no problems with respiration noted  Abdomen: Soft, gravid, appropriate for gestational age. Pain/Pressure: Present     Pelvic:  Cervical exam deferred        Extremities: Normal range of motion.  Edema: Mild pitting, slight indentation  Mental Status: Normal mood and affect. Normal behavior. Normal judgment and thought content.   Assessment and Plan:  Pregnancy: G1P0000 at 6750w3d  1. Encounter for supervision of low-risk pregnancy, antepartum - Encouraged breastfeeding classes  GBS -PCN labor  Term labor symptoms and general obstetric precautions including but not limited to vaginal bleeding, contractions, leaking of fluid and fetal movement were reviewed in detail with the patient. Please refer to After Visit Summary for other counseling  recommendations.  Return in about 1 week (around 01/23/2016) for ROB.  Dorathy KinsmanVirginia Rashelle Ireland, CNM

## 2016-01-16 NOTE — Patient Instructions (Signed)
Group B streptococcus (GBS) is a type of bacteria often found in healthy women. GBS is not the same as the bacteria that causes strep throat. You may have GBS in your vagina, rectum, or bladder. GBS does not spread through sexual contact, but it can be passed to a baby during childbirth. This can be dangerous for your baby. It is not dangerous to you and usually does not cause any symptoms. Your health care provider may test you for GBS when your pregnancy is between 35 and 37 weeks. GBS is dangerous only during birth, so there is no need to test for it earlier. It is possible to have GBS during pregnancy and never pass it to your baby. If your test results are positive for GBS, your health care provider may recommend giving you antibiotic medicine during delivery to make sure your baby stays healthy. RISK FACTORS You are more likely to pass GBS to your baby if:   Your water breaks (ruptured membrane) or you go into labor before 37 weeks.  Your water breaks 18 hours before you deliver.  You passed GBS during a previous pregnancy.  You have a urinary tract infection caused by GBS any time during pregnancy.  You have a fever during labor. SYMPTOMS Most women who have GBS do not have any symptoms. If you have a urinary tract infection caused by GBS, you might have frequent or painful urination and fever. Babies who get GBS usually show symptoms within 7 days of birth. Symptoms may include:   Breathing problems.  Heart and blood pressure problems.  Digestive and kidney problems. DIAGNOSIS Routine screening for GBS is recommended for all pregnant women. A health care provider takes a sample of the fluid in your vagina and rectum with a swab. It is then sent to a lab to be checked for GBS. A sample of your urine may also be checked for the bacteria.  TREATMENT If you test positive for GBS, you may need treatment with an antibiotic medicine during labor. As soon as you go into labor, or as soon as  your membranes rupture, you will get the antibiotic medicine through an IV access. You will continue to get the medicine until after you give birth. You do not need antibiotic medicine if you are having a cesarean delivery.If your baby shows signs or symptoms of GBS after birth, your baby can also be treated with an antibiotic medicine. HOME CARE INSTRUCTIONS   Take all antibiotic medicine as prescribed by your health care provider. Only take medicine as directed.   Continue with prenatal visits and care.   Keep all follow-up appointments.  SEEK MEDICAL CARE IF:   You have pain when you urinate.   You have to urinate frequently.   You have a fever.  SEEK IMMEDIATE MEDICAL CARE IF:   Your membranes rupture.  You go into labor.   This information is not intended to replace advice given to you by your health care provider. Make sure you discuss any questions you have with your health care provider.   Document Released: 06/09/2007 Document Revised: 03/07/2013 Document Reviewed: 12/23/2012 Elsevier Interactive Patient Education 2016 Elsevier Inc.  

## 2016-01-21 DIAGNOSIS — Z3493 Encounter for supervision of normal pregnancy, unspecified, third trimester: Secondary | ICD-10-CM

## 2016-01-27 ENCOUNTER — Ambulatory Visit (INDEPENDENT_AMBULATORY_CARE_PROVIDER_SITE_OTHER): Payer: Medicaid Other | Admitting: Obstetrics and Gynecology

## 2016-01-27 VITALS — BP 115/77 | HR 91 | Wt 272.0 lb

## 2016-01-27 DIAGNOSIS — Z3493 Encounter for supervision of normal pregnancy, unspecified, third trimester: Secondary | ICD-10-CM

## 2016-01-27 DIAGNOSIS — Z3483 Encounter for supervision of other normal pregnancy, third trimester: Secondary | ICD-10-CM | POA: Diagnosis not present

## 2016-01-27 DIAGNOSIS — O99820 Streptococcus B carrier state complicating pregnancy: Secondary | ICD-10-CM

## 2016-01-27 NOTE — Progress Notes (Signed)
   PRENATAL VISIT NOTE  Subjective:  Krista Merritt is a 25 y.o. G1P0000 at 7916w0d being seen today for ongoing prenatal care.  She is currently monitored for the following issues for this low-risk pregnancy and has Eczema; Obesity; Allergy; Preventative health care; Allergic rhinitis; Supervision of low-risk pregnancy; and Group B Streptococcus carrier, antepartum on her problem list.  Patient reports no complaints.  Contractions: Irregular. Vag. Bleeding: None.  Movement: Present. Denies leaking of fluid.   The following portions of the patient's history were reviewed and updated as appropriate: allergies, current medications, past family history, past medical history, past social history, past surgical history and problem list. Problem list updated.  Objective:   Vitals:   01/27/16 1044  BP: 115/77  Pulse: 91  Weight: 272 lb (123.4 kg)    Fetal Status: Fetal Heart Rate (bpm): 144 Fundal Height: 39 cm Movement: Present  Presentation: Vertex  General:  Alert, oriented and cooperative. Patient is in no acute distress.  Skin: Skin is warm and dry. No rash noted.   Cardiovascular: Normal heart rate noted  Respiratory: Normal respiratory effort, no problems with respiration noted  Abdomen: Soft, gravid, appropriate for gestational age. Pain/Pressure: Absent     Pelvic:  Cervical exam performed Dilation: Fingertip Effacement (%): 50 Station: Ballotable  Extremities: Normal range of motion.     Mental Status: Normal mood and affect. Normal behavior. Normal judgment and thought content.   Assessment and Plan:  Pregnancy: G1P0000 at 4516w0d  1. Encounter for supervision of low-risk pregnancy in third trimester Patient is doing well without complaints Will plan for postdate induction of labor if no labor by next visit  2. Group B Streptococcus carrier, antepartum Will provide prophylaxis in labor  Term labor symptoms and general obstetric precautions including but not limited to  vaginal bleeding, contractions, leaking of fluid and fetal movement were reviewed in detail with the patient. Please refer to After Visit Summary for other counseling recommendations.  Return in about 1 week (around 02/03/2016).   Catalina AntiguaPeggy Laurier Jasperson, MD

## 2016-02-05 ENCOUNTER — Inpatient Hospital Stay (HOSPITAL_COMMUNITY): Payer: Medicaid Other

## 2016-02-05 ENCOUNTER — Inpatient Hospital Stay (EMERGENCY_DEPARTMENT_HOSPITAL)
Admission: AD | Admit: 2016-02-05 | Discharge: 2016-02-05 | Disposition: A | Discharge status: Home / Self Care | Payer: Medicaid Other | Source: Ambulatory Visit | Attending: Family Medicine | Admitting: Family Medicine

## 2016-02-05 ENCOUNTER — Ambulatory Visit (INDEPENDENT_AMBULATORY_CARE_PROVIDER_SITE_OTHER): Payer: Medicaid Other | Admitting: Obstetrics & Gynecology

## 2016-02-05 ENCOUNTER — Encounter (HOSPITAL_COMMUNITY): Payer: Self-pay | Admitting: *Deleted

## 2016-02-05 ENCOUNTER — Telehealth: Payer: Self-pay

## 2016-02-05 VITALS — BP 120/80 | HR 93 | Temp 98.0°F | Wt 273.4 lb

## 2016-02-05 DIAGNOSIS — Z3483 Encounter for supervision of other normal pregnancy, third trimester: Secondary | ICD-10-CM | POA: Diagnosis not present

## 2016-02-05 DIAGNOSIS — O99820 Streptococcus B carrier state complicating pregnancy: Secondary | ICD-10-CM

## 2016-02-05 DIAGNOSIS — Z3A40 40 weeks gestation of pregnancy: Secondary | ICD-10-CM

## 2016-02-05 DIAGNOSIS — Z3493 Encounter for supervision of normal pregnancy, unspecified, third trimester: Secondary | ICD-10-CM

## 2016-02-05 DIAGNOSIS — Z3689 Encounter for other specified antenatal screening: Secondary | ICD-10-CM

## 2016-02-05 DIAGNOSIS — O480 Post-term pregnancy: Secondary | ICD-10-CM

## 2016-02-05 NOTE — MAU Provider Note (Signed)
MAU PROVIDER NOTE  Chief Complaint:  Contractions  HPI: Chancy MilroyShamayia E Merritt is a 25 y.o. G1P0000 at 1267w2d sent to MAU with nonreactive NST in office. NST was done for postdates pregnancy, otherwise pregnancy has been uncomplicated.  She has IOL scheduled for Monday.  She reports irregular painful contractions. She reports good fetal movement, denies LOF, vaginal bleeding, vaginal itching/burning, urinary symptoms, h/a, dizziness, n/v, or fever/chills.     Past Medical History: Past Medical History:  Diagnosis Date  . Allergy    seasonal  . Eczema 10/22/2014  . Medical history non-contributory   . Obesity 10/22/2014  . Preventative health care 11/04/2014   Past obstetric history: OB History  Gravida Para Term Preterm AB Living  1 0 0 0 0 0  SAB TAB Ectopic Multiple Live Births  0 0 0 0      # Outcome Date GA Lbr Len/2nd Weight Sex Delivery Anes PTL Lv  1 Current               Past Surgical History: Past Surgical History:  Procedure Laterality Date  . COLPOSCOPY  2012   abnormal cells, cervix, roughly 2012, normal     Family History: Family History  Problem Relation Age of Onset  . Mental illness Father     suicide  . Diabetes Maternal Grandmother   . Asthma Maternal Grandmother   . COPD Paternal Grandmother     recurrent bronchitis  . Arthritis Paternal Grandmother     Social History: Social History  Substance Use Topics  . Smoking status: Never Smoker  . Smokeless tobacco: Never Used  . Alcohol use No     Comment: Ocassionally    Allergies: No Known Allergies  Meds:  Prescriptions Prior to Admission  Medication Sig Dispense Refill Last Dose  . fluticasone (FLONASE) 50 MCG/ACT nasal spray Place 1-2 sprays into both nostrils daily as needed for rhinitis.    Past Month at Unknown time  . Prenat-FeCbn-FeAsp-Meth-FA-DHA (PRENATE MINI) 18-0.6-0.4-350 MG CAPS Take 1 capsule by mouth at bedtime.    02/04/2016 at Unknown time    I have reviewed patient's Past  Medical Hx, Surgical Hx, Family Hx, Social Hx, medications and allergies.   ROS:  A comprehensive ROS was negative except per HPI.    Physical Exam   Patient Vitals for the past 24 hrs:  BP Temp Temp src Pulse Resp  02/05/16 1108 133/74 98.2 F (36.8 C) Oral 94 16   Constitutional: Well-developed, well-nourished female in no acute distress.  Cardiovascular: normal rate Respiratory: normal effort GI: Abd soft, non-tender, gravid appropriate for gestational age. Pos BS x 4 MS: Extremities nontender, no edema, normal ROM Neurologic: Alert and oriented x 4.  GU: Neg CVAT.  Pelvic: NEFG, physiologic discharge, no blood, cervix clean. No CMT  Dilation: 1 Exam by:: per OB's SVE today  FHT:  Baseline 150s , moderate variability, accelerations present (10x10), no decelerations Contractions: q 5-6 mins  Labs: No results found for this or any previous visit (from the past 24 hour(s)).  Imaging:  No results found.  MAU Course: Patient sent from office with nonreactive NST BPP 8/8 Reactive NST in MAU  MDM: Plan of care reviewed with patient, including labs and tests ordered and medical treatment.  Patient advised to come in if going into labor.  Otherwise anticipate IOL Monday or Tuesday.   Assessment: 1. NST (non-stress test) reactive   2. Group B Streptococcus carrier, antepartum   3. Encounter for supervision of low-risk  pregnancy in third trimester   4. [redacted] weeks gestation of pregnancy   5. Post-term pregnancy, 40-42 weeks of gestation    Plan: Discharge home in stable condition.  Labor precautions and fetal kick counts.  Return precautions discussed.  Plan for IOL early next week (Mon/Tues).    Follow-up Information    [facility]   Why:  [facility] will call you with your scheduled induction date/time next week.  Return to MAU for signs of labor or emergencies. Contact information: [address] [place]  [phone]            Medication List    TAKE these medications   fluticasone 50 MCG/ACT nasal spray Commonly known as:  FLONASE Place 1-2 sprays into both nostrils daily as needed for rhinitis.   PRENATE MINI 18-0.6-0.4-350 MG Caps Take 1 capsule by mouth at bedtime.       Freddrick MarchYashika Amin, MD PGY-1 02/05/2016 12:36 PM   CNM attestation:  I have seen and examined this patient; I agree with above documentation in the resident's note.   Krista BudShamayia E Art is a 25 y.o. G1P0000 sent from office for nonreactive NST. +FM, denies LOF, VB, regular contractions, vaginal discharge.  PE: BP 130/74 (BP Location: Left Arm)   Pulse 92   Temp 98.2 F (36.8 C) (Oral)   Resp 18   LMP 04/29/2015  Gen: calm comfortable, NAD Resp: normal effort, no distress Abd: gravid  ROS, labs, PMH reviewed NST with 10 x 10 but not 15 x 15 accels BPP 8/8 today  Cervix 1/50/-2, vertex  MDM: BPP 8/8 today, reassurance provided to pt.  IOL already scheduled for Monday, pt to return to hospital for induction.  Rechecked pt cervix in MAU and membranes swept.  Reviewed labor precautions/warning signs/reasons to return to hospital.  Pt stable at time of discharge.  Plan: D/C home Fetal kick counts reinforced, term labor precautions IOL on Monday Return to MAU as needed for signs of labor/ emergencies  LEFTWICH-KIRBY, Archie Shea, CNM 4:09 PM

## 2016-02-05 NOTE — Progress Notes (Signed)
Patient states that she was having irregular contractions last night, but this morning they have been 7 minutes apart, and she lost her mucous plug, pt reports good fetal movement.

## 2016-02-05 NOTE — MAU Note (Signed)
Pt sent from office.  Was on monitor, contractions noted every 5 min. No bleeding or leaking.  Sent for ? US, fluid check.

## 2016-02-05 NOTE — Telephone Encounter (Signed)
Called to inform patient of induction date/time, no answer, left vm to call back.

## 2016-02-05 NOTE — Progress Notes (Signed)
   PRENATAL VISIT NOTE  Subjective:  Krista Merritt is a 25 y.o. G1P0000 at 4173w2d being seen today for ongoing prenatal care.  She is currently monitored for the following issues for this low-risk pregnancy and has Eczema; Obesity; Allergy; Preventative health care; Allergic rhinitis; Supervision of low-risk pregnancy; and Group B Streptococcus carrier, antepartum on her problem list.  Patient reports contractions since this am.  Contractions: Irregular. Vag. Bleeding: None.  Movement: Present. Denies leaking of fluid.   The following portions of the patient's history were reviewed and updated as appropriate: allergies, current medications, past family history, past medical history, past social history, past surgical history and problem list. Problem list updated.  Objective:   Vitals:   02/05/16 0917  BP: 120/80  Pulse: 93  Temp: 98 F (36.7 C)  Weight: 273 lb 6.4 oz (124 kg)    Fetal Status: Fetal Heart Rate (bpm): NST Fundal Height: 40 cm Movement: Present  Presentation: Vertex  General:  Alert, oriented and cooperative. Patient is in no acute distress.  Skin: Skin is warm and dry. No rash noted.   Cardiovascular: Normal heart rate noted  Respiratory: Normal respiratory effort, no problems with respiration noted  Abdomen: Soft, gravid, appropriate for gestational age. Pain/Pressure: Present     Pelvic:  Cervical exam performed Dilation: 1 Effacement (%): 50 Station: -3  Extremities: Normal range of motion.  Edema: Mild pitting, slight indentation  Mental Status: Normal mood and affect. Normal behavior. Normal judgment and thought content.   Assessment and Plan:  Pregnancy: G1P0000 at 8373w2d  1. Encounter for supervision of low-risk pregnancy in third trimester NST reactive but subjective decreased AFI by bedside portable [place] today, BPP ordered - US MFM FETAL BPP WO NON STRESS; Future  2. Group B Streptococcus carrier, antepartum   Term labor symptoms and general  obstetric precautions including but not limited to vaginal bleeding, contractions, leaking of fluid and fetal movement were reviewed in detail with the patient. Please refer to After Visit Summary for other counseling recommendations.  Return in about 6 weeks (around 03/18/2016) for postpartum.   Adam PhenixJames G Tejon Gracie, MD

## 2016-02-05 NOTE — Progress Notes (Signed)
Patient did not leave urine sample today.

## 2016-02-05 NOTE — MAU Note (Signed)
Sent from OB's office for BPP u/s; ? AFI;

## 2016-02-05 NOTE — Discharge Instructions (Signed)
Reasons to return to MAU:  1.  Contractions are  5 minutes apart or less, each last 1 minute, these have been going on for 1-2 hours, and you cannot walk or talk during them 2.  You have a large gush of fluid, or a trickle of fluid that will not stop and you have to wear a pad 3.  You have bleeding that is bright red, heavier than spotting--like menstrual bleeding (spotting can be normal in early labor or after a check of your cervix) 4.  You do not feel the baby moving like he/she normally does   Introduction Patient Name: ________________________________________________ Patient Due Date: ____________________ What is a fetal movement count? A fetal movement count is the number of times that you feel your baby move during a certain amount of time. This may also be called a fetal kick count. A fetal movement count is recommended for every pregnant woman. You may be asked to start counting fetal movements as early as week 28 of your pregnancy. Pay attention to when your baby is most active. You may notice your baby's sleep and wake cycles. You may also notice things that make your baby move more. You should do a fetal movement count:  When your baby is normally most active.  At the same time each day. A good time to count movements is while you are resting, after having something to eat and drink. How do I count fetal movements? 1. Find a quiet, comfortable area. Sit, or lie down on your side. 2. Write down the date, the start time and stop time, and the number of movements that you felt between those two times. Take this information with you to your health care visits. 3. For 2 hours, count kicks, flutters, swishes, rolls, and jabs. You should feel at least 10 movements during 2 hours. 4. You may stop counting after you have felt 10 movements. 5. If you do not feel 10 movements in 2 hours, have something to eat and drink. Then, keep resting and counting for 1 hour. If you feel at least 4  movements during that hour, you may stop counting. Contact a health care provider if:  You feel fewer than 4 movements in 2 hours.  Your baby is not moving like he or she usually does. Date: ____________ Start time: ____________ Stop time: ____________ Movements: ____________ Date: ____________ Start time: ____________ Stop time: ____________ Movements: ____________ Date: ____________ Start time: ____________ Stop time: ____________ Movements: ____________ Date: ____________ Start time: ____________ Stop time: ____________ Movements: ____________ Date: ____________ Start time: ____________ Stop time: ____________ Movements: ____________ Date: ____________ Start time: ____________ Stop time: ____________ Movements: ____________ Date: ____________ Start time: ____________ Stop time: ____________ Movements: ____________ Date: ____________ Start time: ____________ Stop time: ____________ Movements: ____________ Date: ____________ Start time: ____________ Stop time: ____________ Movements: ____________ This information is not intended to replace advice given to you by your health care provider. Make sure you discuss any questions you have with your health care provider. Document Released: 04/01/2006 Document Revised: 10/30/2015 Document Reviewed: 04/11/2015 Elsevier Interactive Patient Education  2017 [employer]

## 2016-02-06 ENCOUNTER — Inpatient Hospital Stay (HOSPITAL_COMMUNITY): Payer: Medicaid Other | Admitting: Anesthesiology

## 2016-02-06 ENCOUNTER — Inpatient Hospital Stay (HOSPITAL_COMMUNITY)
Admission: AD | Admit: 2016-02-06 | Discharge: 2016-02-08 | DRG: 775 | Disposition: A | Discharge status: Home / Self Care | Payer: Medicaid Other | Source: Ambulatory Visit | Attending: Obstetrics and Gynecology | Admitting: Obstetrics and Gynecology

## 2016-02-06 ENCOUNTER — Encounter (HOSPITAL_COMMUNITY): Payer: Self-pay | Admitting: Anesthesiology

## 2016-02-06 DIAGNOSIS — O99214 Obesity complicating childbirth: Secondary | ICD-10-CM | POA: Diagnosis present

## 2016-02-06 DIAGNOSIS — Z3689 Encounter for other specified antenatal screening: Secondary | ICD-10-CM | POA: Diagnosis not present

## 2016-02-06 DIAGNOSIS — Z6841 Body Mass Index (BMI) 40.0 and over, adult: Secondary | ICD-10-CM | POA: Diagnosis not present

## 2016-02-06 DIAGNOSIS — Z3A40 40 weeks gestation of pregnancy: Secondary | ICD-10-CM

## 2016-02-06 DIAGNOSIS — O479 False labor, unspecified: Secondary | ICD-10-CM | POA: Diagnosis present

## 2016-02-06 DIAGNOSIS — O4202 Full-term premature rupture of membranes, onset of labor within 24 hours of rupture: Secondary | ICD-10-CM | POA: Diagnosis not present

## 2016-02-06 DIAGNOSIS — Z3493 Encounter for supervision of normal pregnancy, unspecified, third trimester: Secondary | ICD-10-CM

## 2016-02-06 DIAGNOSIS — O99824 Streptococcus B carrier state complicating childbirth: Secondary | ICD-10-CM | POA: Diagnosis present

## 2016-02-06 DIAGNOSIS — Z833 Family history of diabetes mellitus: Secondary | ICD-10-CM | POA: Diagnosis not present

## 2016-02-06 DIAGNOSIS — O99820 Streptococcus B carrier state complicating pregnancy: Secondary | ICD-10-CM

## 2016-02-06 LAB — COMPREHENSIVE METABOLIC PANEL
ALBUMIN: 3.2 g/dL — AB (ref 3.5–5.0)
ALK PHOS: 126 U/L (ref 38–126)
ALT: 18 U/L (ref 14–54)
AST: 23 U/L (ref 15–41)
Anion gap: 12 (ref 5–15)
BUN: 9 mg/dL (ref 6–20)
CALCIUM: 9.5 mg/dL (ref 8.9–10.3)
CO2: 16 mmol/L — AB (ref 22–32)
CREATININE: 0.6 mg/dL (ref 0.44–1.00)
Chloride: 107 mmol/L (ref 101–111)
GFR calc non Af Amer: >60 mL/min (ref >60)
GLUCOSE: 94 mg/dL (ref 65–99)
Potassium: 3.9 mmol/L (ref 3.5–5.1)
SODIUM: 135 mmol/L (ref 135–145)
Total Bilirubin: 0.5 mg/dL (ref 0.3–1.2)
Total Protein: 6.6 g/dL (ref 6.5–8.1)

## 2016-02-06 LAB — CBC
HCT: 34.9 % — ABNORMAL LOW (ref 36.0–46.0)
Hemoglobin: 11.3 g/dL — ABNORMAL LOW (ref 12.0–15.0)
MCH: 21.9 pg — AB (ref 26.0–34.0)
MCHC: 32.4 g/dL (ref 30.0–36.0)
MCV: 67.5 fL — AB (ref 78.0–100.0)
PLATELETS: 247 10*3/uL (ref 150–400)
RBC: 5.17 MIL/uL — ABNORMAL HIGH (ref 3.87–5.11)
RDW: 16.2 % — AB (ref 11.5–15.5)
WBC: 9.2 10*3/uL (ref 4.0–10.5)

## 2016-02-06 LAB — PROTEIN / CREATININE RATIO, URINE
Creatinine, Urine: 216 mg/dL
Protein Creatinine Ratio: 0.25 mg/mg{Cre} — ABNORMAL HIGH (ref 0.00–0.15)
Total Protein, Urine: 55 mg/dL

## 2016-02-06 LAB — TYPE AND SCREEN
ABO/RH(D): A POS
Antibody Screen: NEGATIVE

## 2016-02-06 LAB — POCT FERN TEST: POCT Fern Test: POSITIVE

## 2016-02-06 LAB — ABO/RH: ABO/RH(D): A POS

## 2016-02-06 MED ORDER — PHENYLEPHRINE 40 MCG/ML (10ML) SYRINGE FOR IV PUSH (FOR BLOOD PRESSURE SUPPORT)
80.0000 ug | PREFILLED_SYRINGE | INTRAVENOUS | Status: DC | PRN
  Administered 2016-02-06: 80 ug via INTRAVENOUS
  Filled 2016-02-06: qty 10

## 2016-02-06 MED ORDER — FENTANYL CITRATE (PF) 100 MCG/2ML IJ SOLN: 50.0000 ug | INTRAMUSCULAR | Status: DC | PRN

## 2016-02-06 MED ORDER — DEXTROSE 5 % IV SOLN
5.0000 10*6.[IU] | Freq: Once | INTRAVENOUS | Status: AC
  Administered 2016-02-06: 5 10*6.[IU] via INTRAVENOUS
  Filled 2016-02-06: qty 5

## 2016-02-06 MED ORDER — LACTATED RINGERS IV SOLN: 500.0000 mL | Freq: Once | INTRAVENOUS | Status: DC

## 2016-02-06 MED ORDER — SOD CITRATE-CITRIC ACID 500-334 MG/5ML PO SOLN: 30.0000 mL | ORAL | Status: DC | PRN

## 2016-02-06 MED ORDER — PENICILLIN G POT IN DEXTROSE 60000 UNIT/ML IV SOLN
3.0000 10*6.[IU] | INTRAVENOUS | Status: DC
  Administered 2016-02-06: 3 10*6.[IU] via INTRAVENOUS
  Filled 2016-02-06 (×5): qty 50

## 2016-02-06 MED ORDER — LACTATED RINGERS IV SOLN
INTRAVENOUS | Status: DC
  Administered 2016-02-06: 16:00:00 via INTRAVENOUS

## 2016-02-06 MED ORDER — LACTATED RINGERS IV SOLN: 500.0000 mL | INTRAVENOUS | Status: DC | PRN

## 2016-02-06 MED ORDER — EPHEDRINE 5 MG/ML INJ: 10.0000 mg | INTRAVENOUS | Status: DC | PRN

## 2016-02-06 MED ORDER — FLEET ENEMA 7-19 GM/118ML RE ENEM: 1.0000 | ENEMA | RECTAL | Status: DC | PRN

## 2016-02-06 MED ORDER — OXYTOCIN 40 UNITS IN LACTATED RINGERS INFUSION - SIMPLE MED
2.5000 [IU]/h | INTRAVENOUS | Status: DC
  Administered 2016-02-06 – 2016-02-07 (×2): 2.5 [IU]/h via INTRAVENOUS
  Filled 2016-02-06: qty 1000

## 2016-02-06 MED ORDER — PHENYLEPHRINE 40 MCG/ML (10ML) SYRINGE FOR IV PUSH (FOR BLOOD PRESSURE SUPPORT): 80.0000 ug | PREFILLED_SYRINGE | INTRAVENOUS | Status: DC | PRN

## 2016-02-06 MED ORDER — FENTANYL 2.5 MCG/ML BUPIVACAINE 1/10 % EPIDURAL INFUSION (WH - ANES)
14.0000 mL/h | INTRAMUSCULAR | Status: DC | PRN
  Administered 2016-02-06 (×2): 14 mL/h via EPIDURAL
  Filled 2016-02-06 (×2): qty 100

## 2016-02-06 MED ORDER — LIDOCAINE HCL (PF) 1 % IJ SOLN
INTRAMUSCULAR | Status: DC | PRN
  Administered 2016-02-06: 7 mL via EPIDURAL
  Administered 2016-02-06: 8 mL via EPIDURAL

## 2016-02-06 MED ORDER — OXYTOCIN BOLUS FROM INFUSION
500.0000 mL | Freq: Once | INTRAVENOUS | Status: AC
  Administered 2016-02-06: 500 mL via INTRAVENOUS

## 2016-02-06 MED ORDER — OXYCODONE-ACETAMINOPHEN 5-325 MG PO TABS: 2.0000 | ORAL_TABLET | ORAL | Status: DC | PRN

## 2016-02-06 MED ORDER — OXYCODONE-ACETAMINOPHEN 5-325 MG PO TABS: 1.0000 | ORAL_TABLET | ORAL | Status: DC | PRN

## 2016-02-06 MED ORDER — ACETAMINOPHEN 325 MG PO TABS: 650.0000 mg | ORAL_TABLET | ORAL | Status: DC | PRN

## 2016-02-06 MED ORDER — LIDOCAINE HCL (PF) 1 % IJ SOLN
30.0000 mL | INTRAMUSCULAR | Status: DC | PRN
  Administered 2016-02-06: 30 mL via SUBCUTANEOUS
  Filled 2016-02-06: qty 30

## 2016-02-06 MED ORDER — DIPHENHYDRAMINE HCL 50 MG/ML IJ SOLN: 12.5000 mg | INTRAMUSCULAR | Status: DC | PRN

## 2016-02-06 MED ORDER — ONDANSETRON HCL 4 MG/2ML IJ SOLN: 4.0000 mg | Freq: Four times a day (QID) | INTRAMUSCULAR | Status: DC | PRN

## 2016-02-06 MED ORDER — EPHEDRINE 5 MG/ML INJ
10.0000 mg | INTRAVENOUS | Status: DC | PRN
Start: 2016-02-06 — End: 2016-02-07

## 2016-02-06 NOTE — Anesthesia Pain Management Evaluation Note (Signed)
  [facility]NA Pain Management Visit Note  Patient: Krista Merritt, 25 y.o., female  "Hello I am a member of the anesthesia team at [facility]. We have an anesthesia team available at all times to provide care throughout the hospital, including epidural management and anesthesia for C-section. I don't know your plan for the delivery whether it a natural birth, water birth, IV sedation, nitrous supplementation, doula or epidural, but we want to meet your pain goals."   1.Was your pain managed to your expectations on prior hospitalizations?   No prior hospitalizations  2.What is your expectation for pain management during this hospitalization?     Epidural  3.How can we help you reach that goal? Epidural just inserted  Record the patient's initial score and the patient's pain goal.   Pain: 5  Pain Goal: 1 The [facility] wants you to be able to say your pain was always managed very well.  Krista Merritt 02/06/2016

## 2016-02-06 NOTE — H&P (Signed)
LABOR AND DELIVERY ADMISSION HISTORY AND PHYSICAL NOTE  Krista Merritt is a 25 y.o. female G1P0000 with IUP at 6381w3d by LMP presenting for SOL.   She reports positive fetal movement. She denies leakage of fluid or vaginal bleeding.  Prenatal History/Complications:  Past Medical History: Past Medical History:  Diagnosis Date  . Allergy    seasonal  . Eczema 10/22/2014  . Medical history non-contributory   . Obesity 10/22/2014  . Preventative health care 11/04/2014    Past Surgical History: Past Surgical History:  Procedure Laterality Date  . COLPOSCOPY  2012   abnormal cells, cervix, roughly 2012, normal    Obstetrical History: OB History    Gravida Para Term Preterm AB Living   1 0 0 0 0 0   SAB TAB Ectopic Multiple Live Births   0 0 0 0        Social History: Social History   Social History  . Marital status: Single    Spouse name: N/A  . Number of children: N/A  . Years of education: N/A   Occupational History  . teacher pre K    Social History Main Topics  . Smoking status: Never Smoker  . Smokeless tobacco: Never Used  . Alcohol use No     Comment: Ocassionally  . Drug use: No  . Sexual activity: Yes    Partners: Male    Birth control/ protection: None     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, preschool teacher   Other Topics Concern  . None   Social History Narrative  . None    Family History: Family History  Problem Relation Age of Onset  . Mental illness Father     suicide  . Diabetes Maternal Grandmother   . Asthma Maternal Grandmother   . COPD Paternal Grandmother     recurrent bronchitis  . Arthritis Paternal Grandmother     Allergies: No Known Allergies  Prescriptions Prior to Admission  Medication Sig Dispense Refill Last Dose  . fluticasone (FLONASE) 50 MCG/ACT nasal spray Place 1-2 sprays into both nostrils daily as needed for rhinitis.    Months at Unknown time  . Prenat-FeCbn-FeAsp-Meth-FA-DHA (PRENATE  MINI) 18-0.6-0.4-350 MG CAPS Take 1 capsule by mouth at bedtime.    02/05/2016 at Unknown time     Review of Systems   All systems reviewed and negative except as stated in HPI  Blood pressure (!) 163/93, pulse 89, temperature 98.6 F (37 C), temperature source Oral, resp. rate 16, height 5\' 5"  (1.651 m), weight 273 lb (123.8 kg), last menstrual period 04/29/2015, SpO2 99 %. General appearance: alert, cooperative and appears stated age Lungs: clear to auscultation bilaterally Heart: regular rate and rhythm Abdomen: soft, non-tender; bowel sounds normal Extremities: No calf swelling or tenderness Presentation: cephalic Fetal monitoring: Cat I tracing 140bpm/mod/+accels no decels Uterine activity: q3-4 Dilation: 5 Effacement (%): 100 Station: -2 Exam by:: m. wilkins,RN   Prenatal labs: ABO, Rh: --/--/A POS (11/23 1434) Antibody: NEG (11/23 1430) Rubella: !Error! RPR: Non Reactive (08/31 1210)  HBsAg: Negative (04/27 1342)  HIV: Non Reactive (08/31 1210)  GBS: Positive (10/25 1202)  2hr GTT: 81/140/110 Genetic screening:  Not done Anatomy US: wnl  Prenatal Transfer Tool  Maternal Diabetes: No Genetic Screening: Declined Maternal Ultrasounds/Referrals: Normal Fetal Ultrasounds or other Referrals:  None Maternal Substance Abuse:  No Significant Maternal Medications:  None Significant Maternal Lab Results: None  Results for orders placed or performed during the hospital encounter of 02/06/16 (  from the past 24 hour(s))  Fern Test   Collection Time: 02/06/16  2:12 PM  Result Value Ref Range   POCT Fern Test Positive = ruptured amniotic membanes   CBC   Collection Time: 02/06/16  2:30 PM  Result Value Ref Range   WBC 9.2 4.0 - 10.5 K/uL   RBC 5.17 (H) 3.87 - 5.11 MIL/uL   Hemoglobin 11.3 (L) 12.0 - 15.0 g/dL   HCT 16.134.9 (L) 09.636.0 - 04.546.0 %   MCV 67.5 (L) 78.0 - 100.0 fL   MCH 21.9 (L) 26.0 - 34.0 pg   MCHC 32.4 30.0 - 36.0 g/dL   RDW 40.916.2 (H) 81.111.5 - 91.415.5 %   Platelets  247 150 - 400 K/uL  Type and screen [facility]   Collection Time: 02/06/16  2:30 PM  Result Value Ref Range   ABO/RH(D) A POS    Antibody Screen NEG    Sample Expiration 02/09/2016   ABO/Rh   Collection Time: 02/06/16  2:34 PM  Result Value Ref Range   ABO/RH(D) A POS   Comprehensive metabolic panel   Collection Time: 02/06/16  3:20 PM  Result Value Ref Range   Sodium 135 135 - 145 mmol/L   Potassium 3.9 3.5 - 5.1 mmol/L   Chloride 107 101 - 111 mmol/L   CO2 16 (L) 22 - 32 mmol/L   Glucose, Bld 94 65 - 99 mg/dL   BUN 9 6 - 20 mg/dL   Creatinine, Ser 7.820.60 0.44 - 1.00 mg/dL   Calcium 9.5 8.9 - 95.610.3 mg/dL   Total Protein 6.6 6.5 - 8.1 g/dL   Albumin 3.2 (L) 3.5 - 5.0 g/dL   AST 23 15 - 41 U/L   ALT 18 14 - 54 U/L   Alkaline Phosphatase 126 38 - 126 U/L   Total Bilirubin 0.5 0.3 - 1.2 mg/dL   GFR calc non Af Amer >60 >60 mL/min   GFR calc Af Amer >60 >60 mL/min   Anion gap 12 5 - 15  Protein / creatinine ratio, urine   Collection Time: 02/06/16  5:10 PM  Result Value Ref Range   Creatinine, Urine 216.00 mg/dL   Total Protein, Urine 55 mg/dL   Protein Creatinine Ratio 0.25 (H) 0.00 - 0.15 mg/mg[Cre]    Patient Active Problem List   Diagnosis Date Noted  . Uterine contractions during pregnancy 02/06/2016  . Group B Streptococcus carrier, antepartum 01/16/2016  . Supervision of low-risk pregnancy 12/25/2015  . Allergic rhinitis 01/07/2015  . Preventative health care 11/04/2014  . Allergy   . Eczema 10/22/2014  . Obesity 10/22/2014    Assessment: Krista Merritt is a 25 y.o. G1P0000 at 596w3d here for SOL/SROM  #Labor:Anticipate SVD, continue expectant mgmt #Pain: IV Pain meds prn/ Epidural on request  #FWB: Cat I tracing #ID:  GBS [id number])-PCN  #MOF: Breast #MOC:Undecided #Circ:  Darrick Huntsmanutpt  WALLACE, NOAH I, DO PGY-3 02/06/2016, 6:50 PM  I agree with the above plan

## 2016-02-06 NOTE — Anesthesia Preprocedure Evaluation (Signed)
Anesthesia Evaluation  Patient identified by MRN, date of birth, ID band Patient awake    Reviewed: Allergy & Precautions, H&P , NPO status , Patient's Chart, lab work & pertinent test results  Airway Mallampati: III  TM Distance: >3 FB Neck ROM: full    Dental no notable dental hx.    Pulmonary neg pulmonary ROS,    Pulmonary exam normal        Cardiovascular negative cardio ROS Normal cardiovascular exam     Neuro/Psych negative neurological ROS  negative psych ROS   GI/Hepatic negative GI ROS, Neg liver ROS,   Endo/Other  Morbid obesity  Renal/GU negative Renal ROS     Musculoskeletal   Abdominal (+) + obese,   Peds  Hematology negative hematology ROS (+)   Anesthesia Other Findings   Reproductive/Obstetrics (+) Pregnancy                             Anesthesia Physical Anesthesia Plan  ASA: III  Anesthesia Plan: Epidural   Post-op Pain Management:    Induction:   Airway Management Planned:   Additional Equipment:   Intra-op Plan:   Post-operative Plan:   Informed Consent: I have reviewed the patients History and Physical, chart, labs and discussed the procedure including the risks, benefits and alternatives for the proposed anesthesia with the patient or authorized representative who has indicated his/her understanding and acceptance.     Plan Discussed with:   Anesthesia Plan Comments:         Anesthesia Quick Evaluation  

## 2016-02-06 NOTE — Anesthesia Procedure Notes (Signed)
Epidural Patient location during procedure: OB Start time: 02/06/2016 3:35 PM End time: 02/06/2016 3:40 PM  Staffing Anesthesiologist: Leilani AbleHATCHETT, Jillayne Witte Performed: anesthesiologist   Preanesthetic Checklist Completed: patient identified, surgical consent, pre-op evaluation, timeout performed, IV checked, risks and benefits discussed and monitors and equipment checked  Epidural Patient position: sitting Prep: site prepped and draped and DuraPrep Patient monitoring: continuous pulse ox and blood pressure Approach: midline Location: L3-L4 Injection technique: LOR air  Needle:  Needle type: Tuohy  Needle gauge: 17 G Needle length: 9 cm and 9 Needle insertion depth: 9 cm Catheter type: closed end flexible Catheter size: 19 Gauge Catheter at skin depth: 15 cm Test dose: negative and Other  Assessment Sensory level: T9 Events: blood not aspirated, injection not painful, no injection resistance, negative IV test and no paresthesia  Additional Notes Reason for block:procedure for pain

## 2016-02-06 NOTE — MAU Note (Signed)
Possible ROM, ctx every 3-5 minutes

## 2016-02-07 ENCOUNTER — Encounter (HOSPITAL_COMMUNITY): Payer: Self-pay | Admitting: *Deleted

## 2016-02-07 LAB — CBC
HCT: 32.6 % — ABNORMAL LOW (ref 36.0–46.0)
HEMOGLOBIN: 10.9 g/dL — AB (ref 12.0–15.0)
MCH: 22.4 pg — AB (ref 26.0–34.0)
MCHC: 33.4 g/dL (ref 30.0–36.0)
MCV: 67.1 fL — ABNORMAL LOW (ref 78.0–100.0)
PLATELETS: 227 10*3/uL (ref 150–400)
RBC: 4.86 MIL/uL (ref 3.87–5.11)
RDW: 16.3 % — ABNORMAL HIGH (ref 11.5–15.5)
WBC: 19.7 10*3/uL — AB (ref 4.0–10.5)

## 2016-02-07 LAB — RPR: RPR Ser Ql: NONREACTIVE

## 2016-02-07 MED ORDER — SIMETHICONE 80 MG PO CHEW: 80.0000 mg | CHEWABLE_TABLET | ORAL | Status: DC | PRN

## 2016-02-07 MED ORDER — SENNOSIDES-DOCUSATE SODIUM 8.6-50 MG PO TABS
2.0000 | ORAL_TABLET | ORAL | Status: DC
  Administered 2016-02-07: 2 via ORAL
  Filled 2016-02-07: qty 2

## 2016-02-07 MED ORDER — DIPHENHYDRAMINE HCL 25 MG PO CAPS: 25.0000 mg | ORAL_CAPSULE | Freq: Four times a day (QID) | ORAL | Status: DC | PRN

## 2016-02-07 MED ORDER — DIBUCAINE 1 % RE OINT
1.0000 "application " | TOPICAL_OINTMENT | RECTAL | Status: DC | PRN
  Filled 2016-02-07: qty 28

## 2016-02-07 MED ORDER — PRENATAL MULTIVITAMIN CH
1.0000 | ORAL_TABLET | Freq: Every day | ORAL | Status: DC
  Administered 2016-02-07 – 2016-02-08 (×2): 1 via ORAL
  Filled 2016-02-07 (×2): qty 1

## 2016-02-07 MED ORDER — ONDANSETRON HCL 4 MG PO TABS: 4.0000 mg | ORAL_TABLET | ORAL | Status: DC | PRN

## 2016-02-07 MED ORDER — ACETAMINOPHEN 325 MG PO TABS: 650.0000 mg | ORAL_TABLET | ORAL | Status: DC | PRN

## 2016-02-07 MED ORDER — TETANUS-DIPHTH-ACELL PERTUSSIS 5-2.5-18.5 LF-MCG/0.5 IM SUSP
0.5000 mL | Freq: Once | INTRAMUSCULAR | Status: DC
  Filled 2016-02-07: qty 0.5

## 2016-02-07 MED ORDER — ZOLPIDEM TARTRATE 5 MG PO TABS: 5.0000 mg | ORAL_TABLET | Freq: Every evening | ORAL | Status: DC | PRN

## 2016-02-07 MED ORDER — WITCH HAZEL-GLYCERIN EX PADS: 1.0000 "application " | MEDICATED_PAD | CUTANEOUS | Status: DC | PRN

## 2016-02-07 MED ORDER — IBUPROFEN 600 MG PO TABS
600.0000 mg | ORAL_TABLET | Freq: Four times a day (QID) | ORAL | Status: DC
  Administered 2016-02-07 – 2016-02-08 (×3): 600 mg via ORAL
  Filled 2016-02-07 (×6): qty 1

## 2016-02-07 MED ORDER — COCONUT OIL OIL
1.0000 "application " | TOPICAL_OIL | Status: DC | PRN
  Filled 2016-02-07: qty 120

## 2016-02-07 MED ORDER — ONDANSETRON HCL 4 MG/2ML IJ SOLN: 4.0000 mg | INTRAMUSCULAR | Status: DC | PRN

## 2016-02-07 MED ORDER — BENZOCAINE-MENTHOL 20-0.5 % EX AERO
1.0000 "application " | INHALATION_SPRAY | CUTANEOUS | Status: DC | PRN
  Administered 2016-02-07: 1 via TOPICAL
  Filled 2016-02-07 (×2): qty 56

## 2016-02-07 NOTE — Anesthesia Postprocedure Evaluation (Signed)
Anesthesia Post Note  Patient: Krista Merritt  Procedure(s) Performed: * No procedures listed *  Patient location during evaluation: Mother Baby Anesthesia Type: Epidural Level of consciousness: awake and alert, oriented and patient cooperative Pain management: pain level controlled Vital Signs Assessment: post-procedure vital signs reviewed and stable Respiratory status: spontaneous breathing Cardiovascular status: stable Postop Assessment: no headache, no backache, no signs of nausea or vomiting and patient able to bend at knees (OOB without problelms) Anesthetic complications: no     Last Vitals:  Vitals:   02/07/16 1323 02/07/16 1850  BP: 128/65 (!) 141/77  Pulse: 98 79  Resp: (!) 98 18  Temp:  36.9 C    Last Pain:  Vitals:   02/07/16 1850  TempSrc: Oral  PainSc:    Pain Goal:                 Nahuel Wilbert J

## 2016-02-07 NOTE — Lactation Note (Signed)
This note was copied from a baby's chart. Lactation Consultation Note: Lactation Brochure given to mother with basic teaching done. Mother state that infant is biting her nipple.  Observed with gloved finger that infant is biting and has a bubble palate. Mother was taught to do suck training and advised to allow infant to be STS frequently.  Mother prefers not to offer formula at this point. Several attempt to get colostrum . Only a few tiny drops obtained. Infant is 12 hours and has not had a feeding. Suggested that mother page me for next feeding and we will discuss options . Mother was given a hand pump to stimulate flow of colostrum.   Patient Name: Krista Merritt'UToday's Date: 02/07/2016     Maternal Data Has patient been taught Hand Expression?: Yes Does the patient have breastfeeding experience prior to this delivery?: No  Feeding Feeding Type: Breast Fed  LATCH Score/Interventions Latch: Too sleepy or reluctant, no latch achieved, no sucking elicited.  Audible Swallowing: A few with stimulation  Type of Nipple: Everted at rest and after stimulation  Comfort (Breast/Nipple): Soft / non-tender     Hold (Positioning): Full assist, staff holds infant at breast Intervention(s): Breastfeeding basics reviewed;Support Pillows;Position options;Skin to skin  LATCH Score: 5  Lactation Tools Discussed/Used     Consult Status Consult Status: Follow-up Date: 02/07/16 Follow-up type: In-patient    Stevan BornKendrick, Kenney Going [facility] 02/07/2016, 11:57 AM

## 2016-02-07 NOTE — Progress Notes (Signed)
Post Partum Day 1  Subjective:  Krista Merritt is a 25 y.o. G1P1001 4063w3d s/p NSVD @[facility] 2330 11/23.  No acute events overnight.  Pt denies problems with ambulating, voiding or po intake.  She denies nausea or vomiting.  Pain is well controlled.  She has had flatus. She has not had bowel movement.  Lochia Moderate with some clots.  Plan for birth control is undecided.  Method of Feeding: Breast  Objective: BP (!) 114/58 (BP Location: Left Arm) Comment: pt. denies dizziness. light lochia.  Pulse (!) 106   Temp 98 F (36.7 C) (Oral)   Resp 18   Ht 5\' 5"  (1.651 m)   Wt 123.8 kg (273 lb)   LMP 04/29/2015   SpO2 99%   Breastfeeding? Unknown   BMI 45.43 kg/m   Physical Exam:  General: alert, cooperative and no distress Lochia:normal flow Chest: CTAB Heart: RRR no m/r/g Abdomen: +BS, soft, nontender, fundus firm at umbilicus Uterine Fundus: firm DVT Evaluation: No evidence of DVT seen on physical exam. Extremities: No LE edema   Recent Labs  02/06/16 1430 02/07/16 0624  HGB 11.3* 10.9*  HCT 34.9* 32.6*    Assessment/Plan:  ASSESSMENT: Krista Merritt is a 25 y.o. G1P1001 3763w3d ppd #1 s/p NSVD doing well.   Plan for discharge tomorrow, Breastfeeding and Contraception still needs to be decided   LOS: 1 day   Jamelle HaringHillary M Fitzgerald, MD [facility], PGY-2 02/07/2016, 7:59 AM   OB FELLOW POSTPARTUM PROGRESS NOTE ATTESTATION  I have seen and examined this patient and agree with above documentation in the resident's note.   Ernestina PennaNicholas Cadence Haslam, MD 12:02 PM

## 2016-02-07 NOTE — Progress Notes (Signed)
Received pt. From L&D RN via wheelchair in stable condition with newborn.  Patient denies pain, assisted into bed x 2, bed in low position, side rails up x 2, call light within reach.  Newborn showing no sign of distress.  Both in room 134. Visitors at bedside.

## 2016-02-07 NOTE — Lactation Note (Addendum)
This note was copied from a baby's chart. Lactation Consultation Note: Mother states that she attempt to latch infant and he gave a few sucks. Assist mother with latching infant. Infant still refuses breast. Infant doesn't latch on gloved finger. Infant  didn't bite but tongue thrust gloved finger. Mother was fit with a #24 nipple shield and infant refused. Infant was given 5 ml of formula with a gloved finger most of which rolled down his neck. Infant is 7716 hours old. Advised mother to continue to do suck training. Lots of support and encouragement given to mother. Mother really wants to breastfeed. Advised mother to attempt to breast feed and if infant doesn't latch use finger and give formula with curved tip syringe. . Mother is determined to breastfeed. . Informed mother that sometimes a bottle nipple can improve infant suckling ability.   Patient Name: Krista Merritt YQMVH'QToday's Date: 02/07/2016 Reason for consult: Initial assessment   Maternal Data    Feeding Feeding Type: Breast Fed Length of feed: 1 min  LATCH Score/Interventions Latch: Repeated attempts needed to sustain latch, nipple held in mouth throughout feeding, stimulation needed to elicit sucking reflex.  Audible Swallowing: None  Type of Nipple: Everted at rest and after stimulation  Comfort (Breast/Nipple): Soft / non-tender     Hold (Positioning): Full assist, staff holds infant at breast Intervention(s): Position options  LATCH Score: 5  Lactation Tools Discussed/Used     Consult Status Consult Status: Follow-up Date: 02/07/16 Follow-up type: In-patient    Stevan BornKendrick, [name] [facility] 02/07/2016, 3:59 PM

## 2016-02-07 NOTE — Progress Notes (Signed)
UR chart review completed.  

## 2016-02-07 NOTE — Anesthesia Postprocedure Evaluation (Signed)
Anesthesia Post Note  Patient: Krista Merritt  Procedure(s) Performed: * No procedures listed *  Anesthesia Post Evaluation   Last Vitals:  Vitals:   02/07/16 1323 02/07/16 1850  BP: 128/65 (!) 141/77  Pulse: 98 79  Resp: (!) 98 18  Temp:  36.9 C    Last Pain:  Vitals:   02/07/16 1850  TempSrc: Oral  PainSc:    Pain Goal:                 [name]

## 2016-02-08 MED ORDER — IBUPROFEN 600 MG PO TABS: 600.0000 mg | ORAL_TABLET | Freq: Four times a day (QID) | ORAL | 0 refills | Status: DC

## 2016-02-08 NOTE — Discharge Summary (Signed)
OB Discharge Summary     Patient Name: Krista Merritt DOB: 11/29/1990 MRN: [id number]  Date of admission: 02/06/2016 Delivering MD: Casey BurkittFITZGERALD, HILLARY MOEN   Date of discharge: 02/08/2016  Admitting diagnosis: WATER BROKE Intrauterine pregnancy: 7418w3d     Secondary diagnosis:  Active Problems:   Uterine contractions during pregnancy   Postpartum care following vaginal delivery  Additional problems: none except some breastfeeding problems     Discharge diagnosis: Term Pregnancy Delivered                                                                                                Post partum procedures:none  Augmentation: none  Complications: None  Hospital course:  Onset of Labor With Vaginal Delivery     25 y.o. yo G1P1001 at 2718w3d was admitted in Active Labor on 02/06/2016. Patient had an uncomplicated labor course as follows:  Membrane Rupture Time/Date: 1:30 PM ,02/06/2016   Intrapartum Procedures: Episiotomy: None [1]                                         Lacerations:  2nd degree [3];Periurethral [8]  Patient had a delivery of a Viable infant. 02/06/2016  Information for the patient's newborn:  Krista Merritt, Boy Krista Merritt [[id number]][[id number]]  Delivery Method: Vag-Spont    Pateint had an uncomplicated postpartum course.  She is ambulating, tolerating a regular diet, passing flatus, and urinating well. Patient is discharged home in stable condition on 02/08/16.    Physical exam Vitals:   02/07/16 0645 02/07/16 1323 02/07/16 1850 02/08/16 0532  BP: (!) 114/58 128/65 (!) 141/77 (!) 114/50  Pulse: (!) 106 98 79 74  Resp: 18 (!) 98 18 18  Temp: 98 F (36.7 C)  98.5 F (36.9 C) 98.7 F (37.1 C)  TempSrc: Oral Oral Oral Oral  SpO2:      Weight:      Height:       General: alert, cooperative and no distress Lochia: appropriate Uterine Fundus: firm Incision: Healing well with no significant drainage DVT Evaluation: No evidence of DVT seen on physical  exam. Labs: Lab Results  Component Value Date   WBC 19.7 (H) 02/07/2016   HGB 10.9 (L) 02/07/2016   HCT 32.6 (L) 02/07/2016   MCV 67.1 (L) 02/07/2016   PLT 227 02/07/2016   CMP Latest Ref Rng & Units 02/06/2016  Glucose 65 - 99 mg/dL 94  BUN 6 - 20 mg/dL 9  Creatinine 6.960.44 - 2.951.00 mg/dL 2.840.60  Sodium 132135 - 440145 mmol/L 135  Potassium 3.5 - 5.1 mmol/L 3.9  Chloride 101 - 111 mmol/L 107  CO2 22 - 32 mmol/L 16(L)  Calcium 8.9 - 10.3 mg/dL 9.5  Total Protein 6.5 - 8.1 g/dL 6.6  Total Bilirubin 0.3 - 1.2 mg/dL 0.5  Alkaline Phos 38 - 126 U/L 126  AST 15 - 41 U/L 23  ALT 14 - 54 U/L 18    Discharge instruction: per After Visit Summary and "Baby and Me Booklet".  After  visit meds:    Medication List    TAKE these medications   fluticasone 50 MCG/ACT nasal spray Commonly known as:  FLONASE Place 1-2 sprays into both nostrils daily as needed for rhinitis.   PRENATE MINI 18-0.6-0.4-350 MG Caps Take 1 capsule by mouth at bedtime.       Diet: routine diet  Activity: Advance as tolerated. Pelvic rest for 6 weeks.   Outpatient follow up:6 weeks Follow up Appt:Future Appointments Date Time Provider Department Center  02/10/2016 7:30 AM [facility] ROOM [facility] None   Follow up Visit:No Follow-up on file.  Postpartum contraception: probably pills but not sure  Newborn Data: Live born female  Birth Weight: 8 lb 2.5 oz (3700 g) APGAR: 9, 9  Baby Feeding: Bottle and Breast Disposition:rooming in   02/08/2016 [facility]Krista Merritt, CNM

## 2016-02-08 NOTE — Lactation Note (Signed)
This note was copied from a baby's chart. Lactation Consultation Note  Patient Name: Krista Merritt ZOXWR'UToday's Date: 02/08/2016 Reason for consult: Follow-up assessment   With this mom of a term baby, having trouble feeding. On exam of his mouth, he has an upper lip frenulum that is thick and down to his gum line. His tongue has a thick, posterior frenulum, that causes his tongue to severely "cup" with elevation. Mom speaks with her tongue held behind her teeth, with a faint lisp. I spoke to Dr. Ezequiel EssexGable, and she is aware of my assessment. She does not want this baby discharged today, and mom is aware and fine with this. Mom will call for lactation when baby next feeds.  On exam of mom's breasts, they are soft and flat, with minimal colsotrum expressed with hand expression. Mom is now 32 hours post partum. I encouraged her to pump every 3 hours, even if she does not express andy milk, to protect her milk supply. Mom has not pumped since yesterday. She states at this point, she is not that concerned abaout brestfeeidng, but just having her baby feed .  Maternal Data    Feeding Feeding Type:  (enc mom to feed baby)  LATCH Score/Interventions                      Lactation Tools Discussed/Used     Consult Status Consult Status: Follow-up Date: 02/08/16 Follow-up type: In-patient    [name], [name] 02/08/2016, 8:24 AM

## 2016-02-08 NOTE — Lactation Note (Signed)
This note was copied from a baby's chart. Lactation Consultation Note  Patient Name: Krista Merritt ZOXWR'UToday's Date: 02/08/2016 Reason for consult: Follow-up assessment   With this mom of a term infant, now 735 hours old. Mom ws able to bottle feed baby well, and she was very happy. The baby does well with the Dr. Manson PasseyBrown bottle, and he took 20 ml's . I showed mom how to hold baby away from her to keep him awake, and how to gently support his cheeks and chin with her fingers, to cue the baby to make a seal. Mom has decided to switch to formula feeding. Mom knows to call for questions/concerns.    Maternal Data    Feeding Feeding Type: Bottle Fed - Formula Nipple Type: Dr. Irving BurtonBrowns level 1 (not sure of nipple level being used)  [facility]                      Lactation Tools Discussed/Used     Consult Status Consult Status: PRN Date: 02/08/16 Follow-up type: In-patient    [name], Gusta Marksberry Anne 02/08/2016, 10:30 AM

## 2016-02-08 NOTE — Discharge Instructions (Signed)

## 2016-02-09 ENCOUNTER — Ambulatory Visit: Payer: Self-pay

## 2016-02-09 NOTE — Lactation Note (Signed)
This note was copied from a baby's chart. Lactation Consultation Note  Patient Name: Krista Merritt JYNWG'NToday's Date: 02/09/2016 Reason for consult: Follow-up assessment  With this mom and term baby, now 9256 hours old. Mom's breast are fuller today, and she has easily expressed colostrum today. Mom is mostly formula/bottle feeding, but is pumping and finger feeding colostrum. I reviewed engorgement care with mom, but at this time, breasts are full but soft. I encouraged mom to call lactation for o/p consult, if she wants help with breastfeeding, but otherwise, to continue pumping and bottle feeding, EBm/formula. I advised mom to ask the peds MD who discharges her today, what formula she should use, since she was using alimentum while in the hospital.    Maternal Data    Feeding    LATCH Score/Interventions                      Lactation Tools Discussed/Used     Consult Status Consult Status: Complete Follow-up type: Call as needed    [name], Denarius Sesler Anne 02/09/2016, 8:04 AM

## 2016-02-10 ENCOUNTER — Inpatient Hospital Stay (HOSPITAL_COMMUNITY): Admit: 2016-02-10 | Payer: Medicaid Other

## 2016-02-10 ENCOUNTER — Telehealth (HOSPITAL_COMMUNITY): Payer: Self-pay | Admitting: Lactation Services

## 2016-02-11 ENCOUNTER — Telehealth (HOSPITAL_COMMUNITY): Payer: Self-pay

## 2016-02-11 NOTE — Telephone Encounter (Signed)
Lactation telephone call : this call was referred [place] call backs.  Mother has concerns about baby latching on all the way. She states that the infant is still needing a bottle after feeding. Mother ask if she could mix formula with breast milk. Infant is 665 days old. Mother states that infant was mostly bottle feeding in hospital and now she wants the infant to exclusively breastfeed or as she say latch all the way. Mother states that breastfeeding got better yesterday.    I advised mother to offer breast each time she breastfeeds instead of giving a bottle right away.   This was mothers first infant so I suggested that she come for an out patient visit. Mother seemed interested and states that she will call and schedule an appointment.  Mother very attentive and denies having any other questions.   Enc[facility] for any future concerns.

## 2016-03-13 ENCOUNTER — Ambulatory Visit (INDEPENDENT_AMBULATORY_CARE_PROVIDER_SITE_OTHER): Payer: PRIVATE HEALTH INSURANCE | Admitting: Family Medicine

## 2016-03-13 ENCOUNTER — Encounter: Payer: Self-pay | Admitting: Family Medicine

## 2016-03-13 VITALS — BP 118/89 | HR 72 | Temp 98.2°F | Ht 66.0 in | Wt 257.8 lb

## 2016-03-13 DIAGNOSIS — R609 Edema, unspecified: Secondary | ICD-10-CM

## 2016-03-13 DIAGNOSIS — R202 Paresthesia of skin: Secondary | ICD-10-CM

## 2016-03-13 DIAGNOSIS — M25562 Pain in left knee: Secondary | ICD-10-CM

## 2016-03-13 DIAGNOSIS — M25561 Pain in right knee: Secondary | ICD-10-CM | POA: Diagnosis not present

## 2016-03-13 DIAGNOSIS — M25569 Pain in unspecified knee: Secondary | ICD-10-CM | POA: Insufficient documentation

## 2016-03-13 HISTORY — DX: Paresthesia of skin: R20.2

## 2016-03-13 NOTE — Patient Instructions (Addendum)
Topical Aspercreme or Salon PAS to area 2 times a day Compression hose for legs  Vitamins   Edema Edema is an abnormal buildup of fluids in your bodytissues. Edema is somewhatdependent on gravity to pull the fluid to the lowest place in your body. That makes the condition more common in the legs and thighs (lower extremities). Painless swelling of the feet and ankles is common and becomes more likely as you get older. It is also common in looser tissues, like around your eyes. When the affected area is squeezed, the fluid may move out of that spot and leave a dent for a few moments. This dent is called pitting. What are the causes? There are many possible causes of edema. Eating too much salt and being on your feet or sitting for a long time can cause edema in your legs and ankles. Hot weather may make edema worse. Common medical causes of edema include:  Heart failure.  Liver disease.  Kidney disease.  Weak blood vessels in your legs.  Cancer.  An injury.  Pregnancy.  Some medications.  Obesity. What are the signs or symptoms? Edema is usually painless.Your skin may look swollen or shiny. How is this diagnosed? Your health care provider may be able to diagnose edema by asking about your medical history and doing a physical exam. You may need to have tests such as X-rays, an electrocardiogram, or blood tests to check for medical conditions that may cause edema. How is this treated? Edema treatment depends on the cause. If you have heart, liver, or kidney disease, you need the treatment appropriate for these conditions. General treatment may include:  Elevation of the affected body part above the level of your heart.  Compression of the affected body part. Pressure from elastic bandages or support stockings squeezes the tissues and forces fluid back into the blood vessels. This keeps fluid from entering the tissues.  Restriction of fluid and salt intake.  Use of a water  pill (diuretic). These medications are appropriate only for some types of edema. They pull fluid out of your body and make you urinate more often. This gets rid of fluid and reduces swelling, but diuretics can have side effects. Only use diuretics as directed by your health care provider. Follow these instructions at home:  Keep the affected body part above the level of your heart when you are lying down.  Do not sit still or stand for prolonged periods.  Do not put anything directly under your knees when lying down.  Do not wear constricting clothing or garters on your upper legs.  Exercise your legs to work the fluid back into your blood vessels. This may help the swelling go down.  Wear elastic bandages or support stockings to reduce ankle swelling as directed by your health care provider.  Eat a low-salt diet to reduce fluid if your health care provider recommends it.  Only take medicines as directed by your health care provider. Contact a health care provider if:  Your edema is not responding to treatment.  You have heart, liver, or kidney disease and notice symptoms of edema.  You have edema in your legs that does not improve after elevating them.  You have sudden and unexplained weight gain. Get help right away if:  You develop shortness of breath or chest pain.  You cannot breathe when you lie down.  You develop pain, redness, or warmth in the swollen areas.  You have heart, liver, or kidney disease and  suddenly get edema.  You have a fever and your symptoms suddenly get worse. This information is not intended to replace advice given to you by your health care provider. Make sure you discuss any questions you have with your health care provider. Document Released: 03/02/2005 Document Revised: 08/08/2015 Document Reviewed: 12/23/2012 Elsevier Interactive Patient Education  2017 [employer].

## 2016-03-13 NOTE — Assessment & Plan Note (Signed)
Mild around joints since pregnancy. Restart MVI with minerals and check labs should resolve over next monthbut will report worsening symptoms

## 2016-03-13 NOTE — Assessment & Plan Note (Signed)
Mild around ankles since pregnancy. Gave birth about a month ago. Encouraged to increase activity, minimize sale and try compression hose. Check labs today and report worsening symptoms

## 2016-03-13 NOTE — Progress Notes (Signed)
Pre visit review using our clinic review tool, if applicable. No additional management support is needed unless otherwise documented below in the visit note. 

## 2016-03-13 NOTE — Progress Notes (Signed)
Patient ID: Krista Merritt, female   DOB: [birth year], 25 y.o.   MRN: [id number]   Subjective:    Patient ID: Krista Merritt, female    DOB: [birth year], 25 y.o.   MRN: [id number]  Chief Complaint  Patient presents with  . Edema    HPI Patient is in today for evaluation of what she describes as a dull ache and mild swelling in b/l wrists, knees and ankles. She says it started during her recent pregnancy and a month after delivery has still not resolved. No redness or warmth, no trauma. Pregnancy was otherwise unremarkable and she denies any headaches, htn or need for bed rest. Otherwise she reports feeling well today except for tired due to lack of sleep. Denies CP/palp/SOB/HA/congestion/fevers/GI or GU c/o. Taking meds as prescribed  Past Medical History:  Diagnosis Date  . Allergy    seasonal  . Eczema 10/22/2014  . Medical history non-contributory   . Obesity 10/22/2014  . Paresthesia 03/13/2016  . Preventative health care 11/04/2014    Past Surgical History:  Procedure Laterality Date  . COLPOSCOPY  2012   abnormal cells, cervix, roughly 2012, normal    Family History  Problem Relation Age of Onset  . Mental illness Father     suicide  . Diabetes Maternal Grandmother   . Asthma Maternal Grandmother   . COPD Paternal Grandmother     recurrent bronchitis  . Arthritis Paternal Grandmother     Social History   Social History  . Marital status: Single    Spouse name: N/A  . Number of children: N/A  . Years of education: N/A   Occupational History  . teacher pre K    Social History Main Topics  . Smoking status: Never Smoker  . Smokeless tobacco: Never Used  . Alcohol use No     Comment: Ocassionally  . Drug use: No  . Sexual activity: Yes    Partners: Male    Birth control/ protection: None     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, preschool teacher   Other Topics Concern  . Not on file   Social History Narrative  . No narrative on  file    Outpatient Medications Prior to Visit  Medication Sig Dispense Refill  . fluticasone (FLONASE) 50 MCG/ACT nasal spray Place 1-2 sprays into both nostrils daily as needed for rhinitis.     Marland Kitchen. ibuprofen (ADVIL,MOTRIN) 600 MG tablet Take 1 tablet (600 mg total) by mouth every 6 (six) hours. 30 tablet 0  . Prenat-FeCbn-FeAsp-Meth-FA-DHA (PRENATE MINI) 18-0.6-0.4-350 MG CAPS Take 1 capsule by mouth at bedtime.      No facility-administered medications prior to visit.     No Known Allergies  Review of Systems  Constitutional: Positive for malaise/fatigue.  Cardiovascular: Positive for leg swelling.  Gastrointestinal: Negative for abdominal pain.  Musculoskeletal: Positive for joint pain. Negative for back pain and neck pain.  Neurological: Negative for headaches.       Objective:    Physical Exam  Constitutional: She is oriented to person, place, and time. She appears well-developed and well-nourished. No distress.  HENT:  Head: Normocephalic and atraumatic.  Nose: Nose normal.  Eyes: Right eye exhibits no discharge. Left eye exhibits no discharge.  Neck: Normal range of motion. Neck supple.  Cardiovascular: Normal rate and regular rhythm.   No murmur heard. Pulmonary/Chest: Effort normal and breath sounds normal.  Abdominal: Soft. Bowel sounds are normal. There is no tenderness.  Musculoskeletal: She exhibits edema.  Trace pedal edema b/l  Neurological: She is alert and oriented to person, place, and time.  Skin: Skin is warm and dry.  Psychiatric: She has a normal mood and affect.  Nursing note and vitals reviewed.   BP 118/89 (BP Location: Left Arm, Patient Position: Sitting, Cuff Size: Normal)   Pulse 72   Temp 98.2 F (36.8 C) (Oral)   Ht 5\' 6"  (1.676 m)   Wt 257 lb 12.8 oz (116.9 kg)   SpO2 99%   Breastfeeding? No   BMI 41.61 kg/m  Wt Readings from Last 3 Encounters:  03/13/16 257 lb 12.8 oz (116.9 kg)  02/06/16 273 lb (123.8 kg)  02/05/16 273 lb 6.4 oz  (124 kg)     Lab Results  Component Value Date   WBC 19.7 (H) 02/07/2016   HGB 10.9 (L) 02/07/2016   HCT 32.6 (L) 02/07/2016   PLT 227 02/07/2016   GLUCOSE 94 02/06/2016   CHOL 156 10/22/2014   TRIG 106.0 10/22/2014   HDL 41.10 10/22/2014   LDLCALC 94 10/22/2014   ALT 18 02/06/2016   AST 23 02/06/2016   NA 135 02/06/2016   K 3.9 02/06/2016   CL 107 02/06/2016   CREATININE 0.60 02/06/2016   BUN 9 02/06/2016   CO2 16 (L) 02/06/2016   TSH 0.97 10/22/2014   HGBA1C 5.8 10/23/2014    Lab Results  Component Value Date   TSH 0.97 10/22/2014   Lab Results  Component Value Date   WBC 19.7 (H) 02/07/2016   HGB 10.9 (L) 02/07/2016   HCT 32.6 (L) 02/07/2016   MCV 67.1 (L) 02/07/2016   PLT 227 02/07/2016   Lab Results  Component Value Date   NA 135 02/06/2016   K 3.9 02/06/2016   CO2 16 (L) 02/06/2016   GLUCOSE 94 02/06/2016   BUN 9 02/06/2016   CREATININE 0.60 02/06/2016   BILITOT 0.5 02/06/2016   ALKPHOS 126 02/06/2016   AST 23 02/06/2016   ALT 18 02/06/2016   PROT 6.6 02/06/2016   ALBUMIN 3.2 (L) 02/06/2016   CALCIUM 9.5 02/06/2016   ANIONGAP 12 02/06/2016   GFR 111.96 10/22/2014   Lab Results  Component Value Date   CHOL 156 10/22/2014   Lab Results  Component Value Date   HDL 41.10 10/22/2014   Lab Results  Component Value Date   LDLCALC 94 10/22/2014   Lab Results  Component Value Date   TRIG 106.0 10/22/2014   Lab Results  Component Value Date   CHOLHDL 4 10/22/2014   Lab Results  Component Value Date   HGBA1C 5.8 10/23/2014       Assessment & Plan:   Problem List Items Addressed This Visit    Paresthesia    Mild around joints since pregnancy. Restart MVI with minerals and check labs should resolve over next monthbut will report worsening symptoms      Relevant Orders   CBC   Comprehensive metabolic panel   TSH   Uric acid   Sedimentation rate   Pain in joint, lower leg    Describes b/l wrist, elbow and ankle discomfort since  pregnancy, more of a dull ache than sharp pains, no redness or warmth. Encouraged topical NSAIDs and check labs      Relevant Orders   CBC   Comprehensive metabolic panel   TSH   Uric acid   Sedimentation rate   Edema - Primary    Mild around ankles since pregnancy. Gave birth about a month ago.  Encouraged to increase activity, minimize sale and try compression hose. Check labs today and report worsening symptoms      Relevant Orders   CBC   Comprehensive metabolic panel   TSH   Uric acid   Sedimentation rate      I have discontinued Ms. Bilton's fluticasone, PRENATE MINI, and ibuprofen.  No orders of the defined types were placed in this encounter.    Danise Edge, MD

## 2016-03-13 NOTE — Assessment & Plan Note (Signed)
Describes b/l wrist, elbow and ankle discomfort since pregnancy, more of a dull ache than sharp pains, no redness or warmth. Encouraged topical NSAIDs and check labs

## 2016-06-11 ENCOUNTER — Ambulatory Visit: Payer: PRIVATE HEALTH INSURANCE | Admitting: Family Medicine

## 2016-08-11 ENCOUNTER — Emergency Department (HOSPITAL_COMMUNITY)
Admission: EM | Admit: 2016-08-11 | Discharge: 2016-08-11 | Disposition: A | Discharge status: Home / Self Care | Payer: PRIVATE HEALTH INSURANCE | Attending: Emergency Medicine | Admitting: Emergency Medicine

## 2016-08-11 ENCOUNTER — Encounter (HOSPITAL_COMMUNITY): Payer: Self-pay

## 2016-08-11 DIAGNOSIS — Z79899 Other long term (current) drug therapy: Secondary | ICD-10-CM | POA: Insufficient documentation

## 2016-08-11 DIAGNOSIS — K047 Periapical abscess without sinus: Secondary | ICD-10-CM

## 2016-08-11 MED ORDER — HYDROCODONE-ACETAMINOPHEN 5-325 MG PO TABS: 1.0000 | ORAL_TABLET | ORAL | 0 refills | Status: DC | PRN

## 2016-08-11 MED ORDER — AMOXICILLIN-POT CLAVULANATE 875-125 MG PO TABS: 1.0000 | ORAL_TABLET | Freq: Two times a day (BID) | ORAL | 0 refills | Status: AC

## 2016-08-11 MED ORDER — IBUPROFEN 400 MG PO TABS: 600.0000 mg | ORAL_TABLET | Freq: Three times a day (TID) | ORAL | 0 refills | Status: DC | PRN

## 2016-08-11 MED ORDER — CEFTRIAXONE SODIUM 1 G IJ SOLR
1.0000 g | INTRAMUSCULAR | Status: DC
  Administered 2016-08-11: 1 g via INTRAMUSCULAR
  Filled 2016-08-11: qty 10

## 2016-08-11 MED ORDER — IBUPROFEN 600 MG PO TABS: 600.0000 mg | ORAL_TABLET | Freq: Three times a day (TID) | ORAL | 0 refills | Status: DC | PRN

## 2016-08-11 MED ORDER — LIDOCAINE HCL 1 % IJ SOLN
5.0000 mL | Freq: Once | INTRAMUSCULAR | Status: AC
  Administered 2016-08-11: 2.1 mL
  Filled 2016-08-11: qty 20

## 2016-08-11 NOTE — ED Provider Notes (Signed)
[facility] Provider Note   CSN: [id number] Arrival date & time: 08/11/16  1135   By signing my name below, I, Krista Merritt, attest that this documentation has been prepared under the direction and in the presence of Shaune Pollack, MD. Electronically Signed: Soijett Merritt, ED Scribe. 08/11/16. 1:08 PM.  History   Chief Complaint Chief Complaint  Patient presents with  . Abscess    HPI Krista Merritt is a 26 y.o. female who presents to the Emergency Department complaining of 5/10, aching, right upper back dental pain onset 2 days ago. Pt reports associated right sided facial swelling (cheek) x today and HA. Pt has tried saltwater gargles and Rx IBU with no relief of her symptoms. She notes that 3 weeks ago she had a left sided back tooth extracted and was informed that she would need a right upper molar pulled. She states that she didn't have an abscess to any teeth at the time of extraction. Pt reports that she was prescribed amoxil and completed the course 1 week ago. Pt notes that she bit into something hard prior to the onset of her symptoms x 2 days ago. She denies fever, nausea, vomiting, tongue swelling, ear pain, neck pain, cough, rhinorrhea, drainage from affected tooth, vision changes, and any other symptoms.    The history is provided by the patient. No language interpreter was used.    Past Medical History:  Diagnosis Date  . Allergy    seasonal  . Eczema 10/22/2014  . Medical history non-contributory   . Obesity 10/22/2014  . Paresthesia 03/13/2016  . Preventative health care 11/04/2014    Patient Active Problem List   Diagnosis Date Noted  . Paresthesia 03/13/2016  . Pain in joint, lower leg 03/13/2016  . Edema 03/13/2016  . Postpartum care following vaginal delivery 02/08/2016  . Uterine contractions during pregnancy 02/06/2016  . Group B Streptococcus carrier, antepartum 01/16/2016  . Supervision of low-risk pregnancy 12/25/2015  . Allergic rhinitis  01/07/2015  . Preventative health care 11/04/2014  . Allergy   . Eczema 10/22/2014  . Obesity 10/22/2014    Past Surgical History:  Procedure Laterality Date  . COLPOSCOPY  2012   abnormal cells, cervix, roughly 2012, normal    OB History    Gravida Para Term Preterm AB Living   1 1 1  0 0 1   SAB TAB Ectopic Multiple Live Births   0 0 0 0 1       Home Medications    Prior to Admission medications   Medication Sig Start Date End Date Taking? Authorizing Provider  amoxicillin-clavulanate (AUGMENTIN) 875-125 MG tablet Take 1 tablet by mouth every 12 (twelve) hours. 08/11/16 08/21/16  Shaune Pollack, MD  HYDROcodone-acetaminophen (NORCO/VICODIN) 5-325 MG tablet Take 1-2 tablets by mouth every 4 (four) hours as needed for severe pain. 08/11/16   Shaune Pollack, MD  ibuprofen (ADVIL,MOTRIN) 600 MG tablet Take 1 tablet (600 mg total) by mouth every 8 (eight) hours as needed for moderate pain. 08/11/16   Shaune Pollack, MD    Family History Family History  Problem Relation Age of Onset  . Mental illness Father        suicide  . Diabetes Maternal Grandmother   . Asthma Maternal Grandmother   . COPD Paternal Grandmother        recurrent bronchitis  . Arthritis Paternal Grandmother     Social History Social History  Substance Use Topics  . Smoking status: Never Smoker  . Smokeless tobacco:  Never Used  . Alcohol use No     Comment: Ocassionally     Allergies   Patient has no known allergies.   Review of Systems Review of Systems  Constitutional: Negative for fever.  HENT: Positive for dental problem (right upper molar). Negative for ear pain and rhinorrhea.        +right sided facial swelling (cheek). No drainage from affected tooth. No tongue swelling  Eyes: Negative for visual disturbance.  Respiratory: Negative for cough.   Gastrointestinal: Negative for nausea and vomiting.  Musculoskeletal: Negative for neck pain.  Neurological: Positive for headaches.      Physical Exam Updated Vital Signs BP (!) 114/96 (BP Location: Left Arm)   Pulse 81   Temp 98.6 F (37 C) (Oral)   Resp 20   Ht 5\' 6"  (1.676 m)   Wt 230 lb (104.3 kg)   LMP 08/03/2016   SpO2 99%   BMI 37.12 kg/m   Physical Exam  Constitutional: She is oriented to person, place, and time. She appears well-developed and well-nourished. No distress.  HENT:  Head: Normocephalic and atraumatic.  Right Ear: Tympanic membrane, external ear and ear canal normal.  Left Ear: Tympanic membrane, external ear and ear canal normal.  Mouth/Throat: Uvula is midline, oropharynx is clear and moist and mucous membranes are normal. Abnormal dentition. No dental abscesses.  Mild right maxillary cheek swelling without erythema or fluctuance. Right upper 2nd premolar has gingival erythema and tenderness with no discrete abscess. Significant TTP to tooth. No sublingual swelling or edema.   Eyes: EOM are normal.  Neck: Neck supple.  Supple no swelling or tenderness.   Cardiovascular: Normal rate, regular rhythm and normal heart sounds.  Exam reveals no gallop and no friction rub.   No murmur heard. Pulmonary/Chest: Effort normal and breath sounds normal. No respiratory distress. She has no wheezes. She has no rales.  Abdominal: She exhibits no distension.  Musculoskeletal: Normal range of motion.  Neurological: She is alert and oriented to person, place, and time.  Skin: Skin is warm and dry.  Psychiatric: She has a normal mood and affect. Her behavior is normal.  Nursing note and vitals reviewed.    ED Treatments / Results  DIAGNOSTIC STUDIES: Oxygen Saturation is 99% on RA, nl by my interpretation.    COORDINATION OF CARE: 1:05 PM Discussed treatment plan with pt at bedside and pt agreed to plan.   Labs (all labs ordered are listed, but only abnormal results are displayed) Labs Reviewed - No data to display  EKG  EKG Interpretation None       Radiology No results  found.  Procedures Procedures (including critical care time)  Medications Ordered in ED Medications  cefTRIAXone (ROCEPHIN) injection 1 g (1 g Intramuscular Given 08/11/16 1327)  lidocaine (XYLOCAINE) 1 % (with pres) injection 5 mL (2.1 mLs Other Given 08/11/16 1327)     Initial Impression / Assessment and Plan / ED Course  I have reviewed the triage vital signs and the nursing notes.  Pertinent labs & imaging results that were available during my care of the patient were reviewed by me and considered in my medical decision making (see chart for details).    26 year old female with past medical history of recurrent dental infections here with right upper tooth pain. On arrival, vital signs are stable. Patient is afebrile and without signs systemic illness. Exam shows likely early dental abscess versus reversible pulpitis of the right upper second premolar. There is no evidence  of Ludwigs or deep space or neck infection. No discrete or drainable abscess on my exam. No evidence of concomitant facial or periorbital cellulitis. Will place the patient on Augmentin and discharged with close dentistry follow-up. Patient also given an IM shot here of Rocephin.  Final Clinical Impressions(s) / ED Diagnoses   Final diagnoses:  Dental abscess    New Prescriptions New Prescriptions   AMOXICILLIN-CLAVULANATE (AUGMENTIN) 875-125 MG TABLET    Take 1 tablet by mouth every 12 (twelve) hours.   HYDROCODONE-ACETAMINOPHEN (NORCO/VICODIN) 5-325 MG TABLET    Take 1-2 tablets by mouth every 4 (four) hours as needed for severe pain.   IBUPROFEN (ADVIL,MOTRIN) 600 MG TABLET    Take 1 tablet (600 mg total) by mouth every 8 (eight) hours as needed for moderate pain.    I personally performed the services described in this documentation, which was scribed in my presence. The recorded information has been reviewed and is accurate.    Shaune Pollack, MD 08/11/16 [phone]

## 2016-08-11 NOTE — ED Triage Notes (Signed)
Per Pt, Pt was sent by her dentist to receive IV antibiotics. Pt had infection in her tooth four weeks ago on the left. Pt had this tooth removed. Today, she called dentist about swelling and pain on the right, pt was told to come for evaluation. Denies fevers.

## 2016-11-28 IMAGING — US US MFM FETAL BPP W/O NON-STRESS
1 series · 14 of 27 positions shown · non-contrast
Comparison: none

[Series 1: us mfm fetal bpp w/o non-stress · 27 acquisitions, 14 frames shown]
[im 1/27]
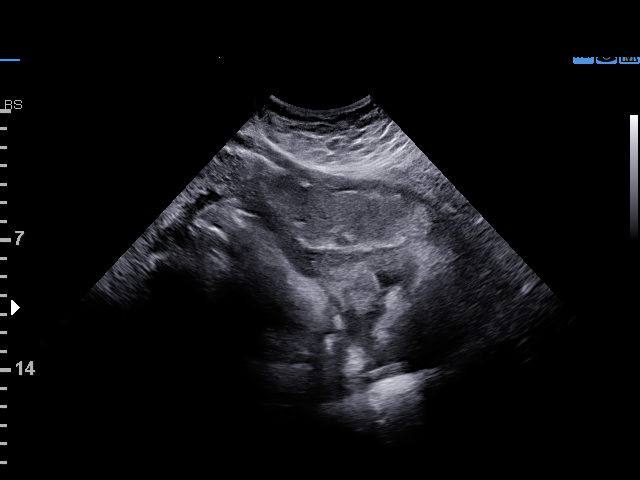
[im 3/27]
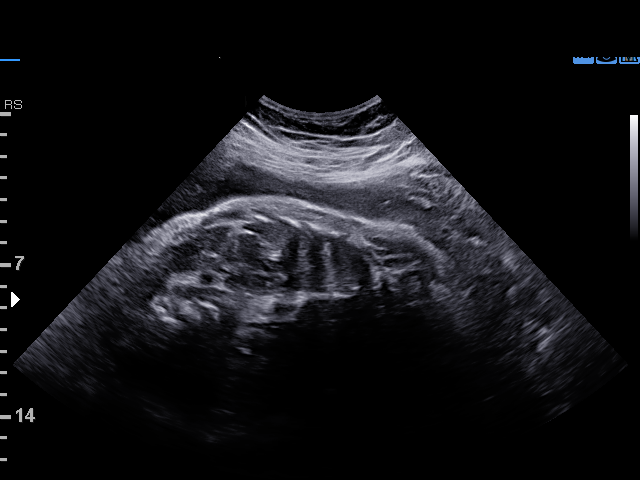
[im 5/27]
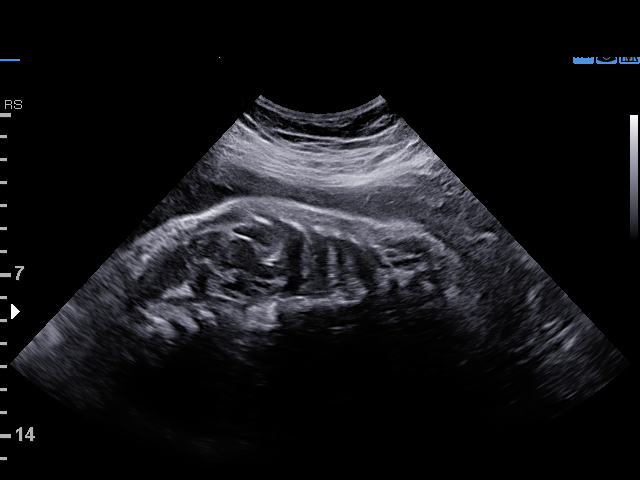
[im 7/27]
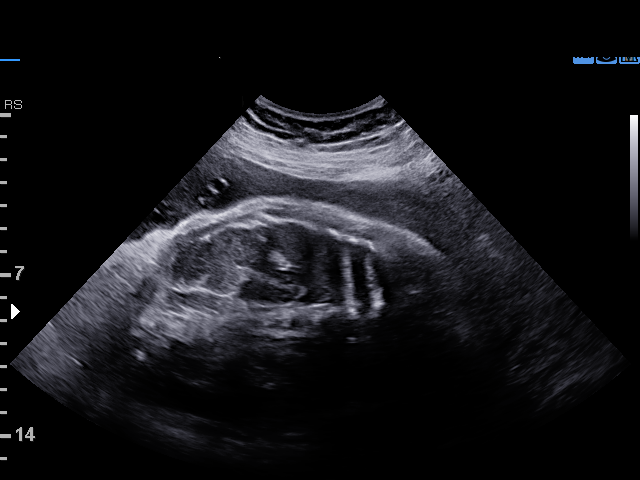
[im 9/27]
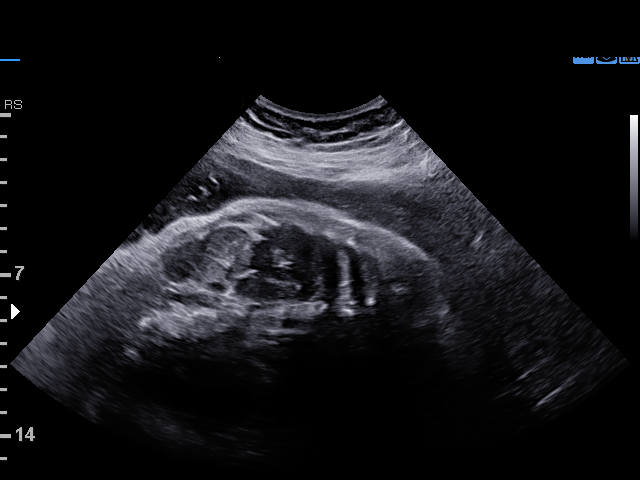
[im 11/27]
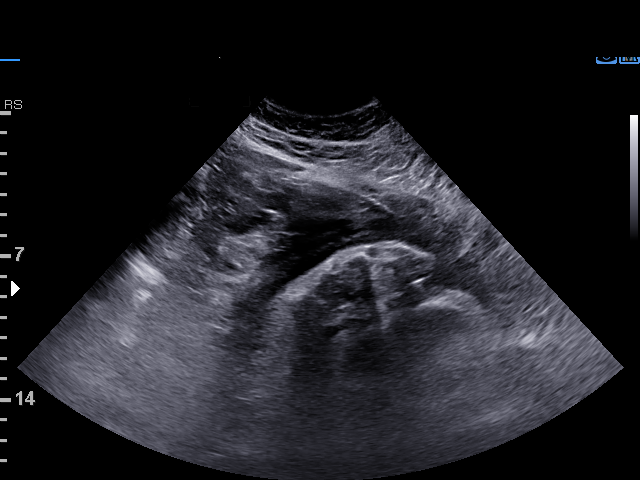
[im 13/27]
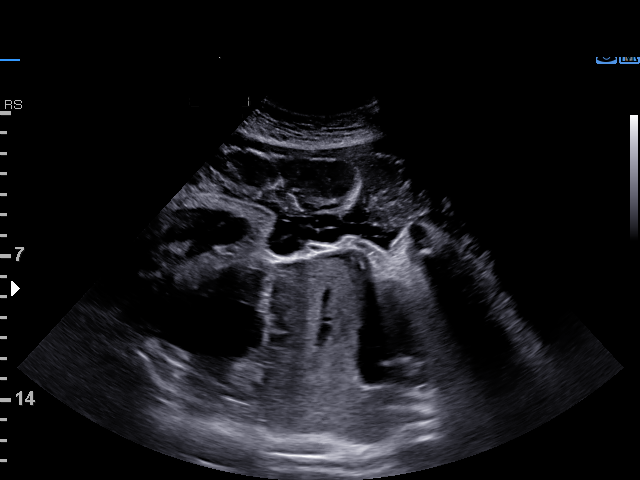
[im 15/27]
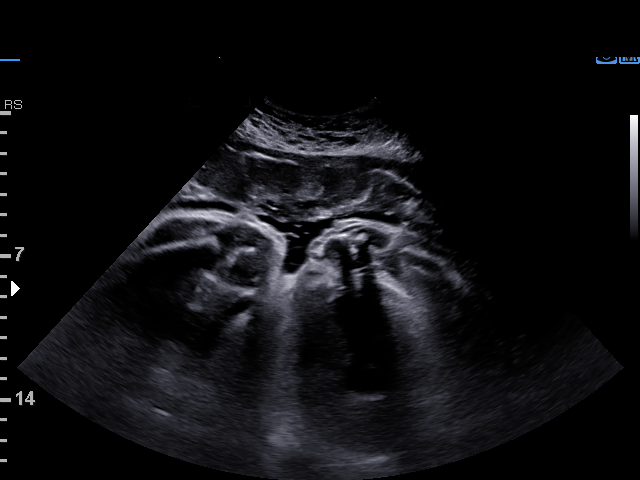
[im 17/27]
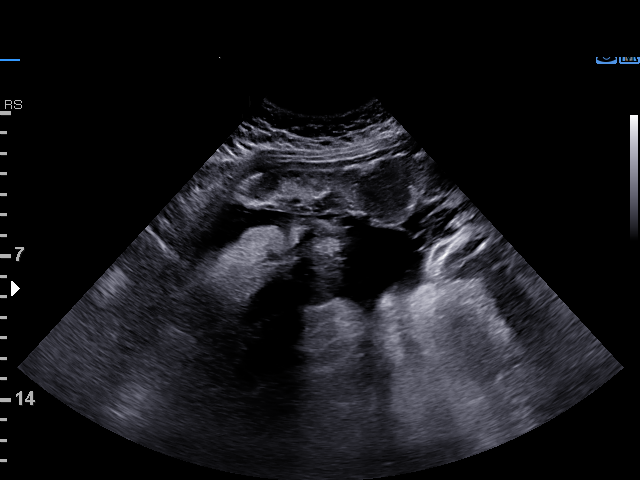
[im 19/27]
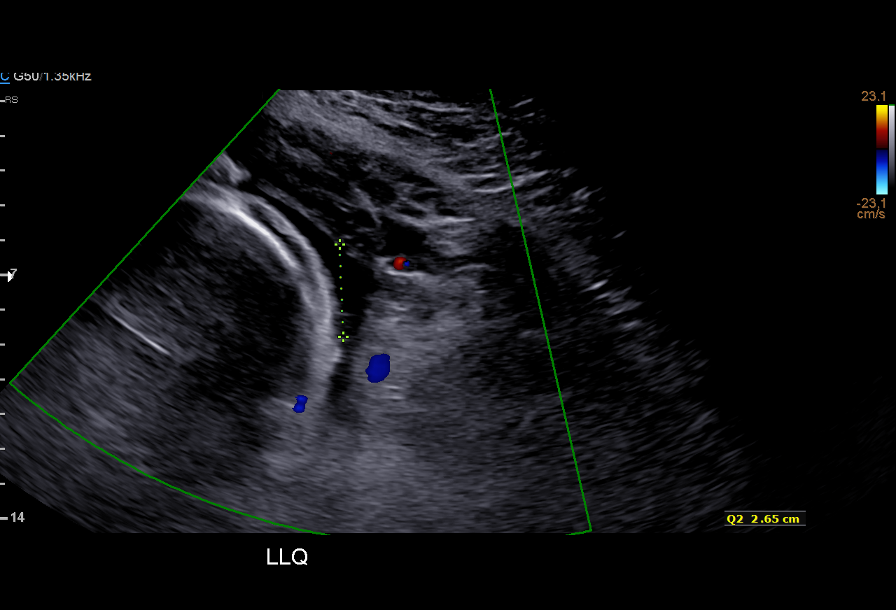
[im 21/27]
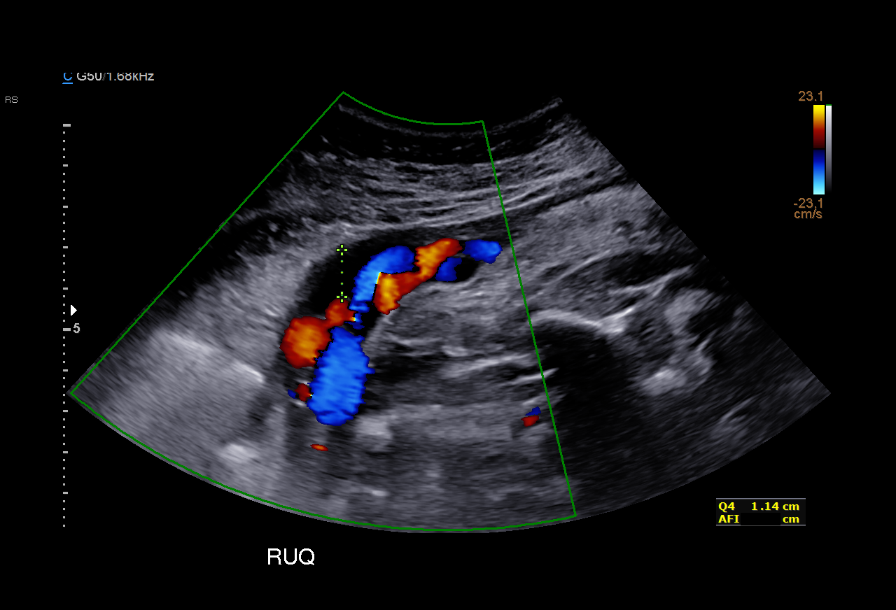
[im 23/27]
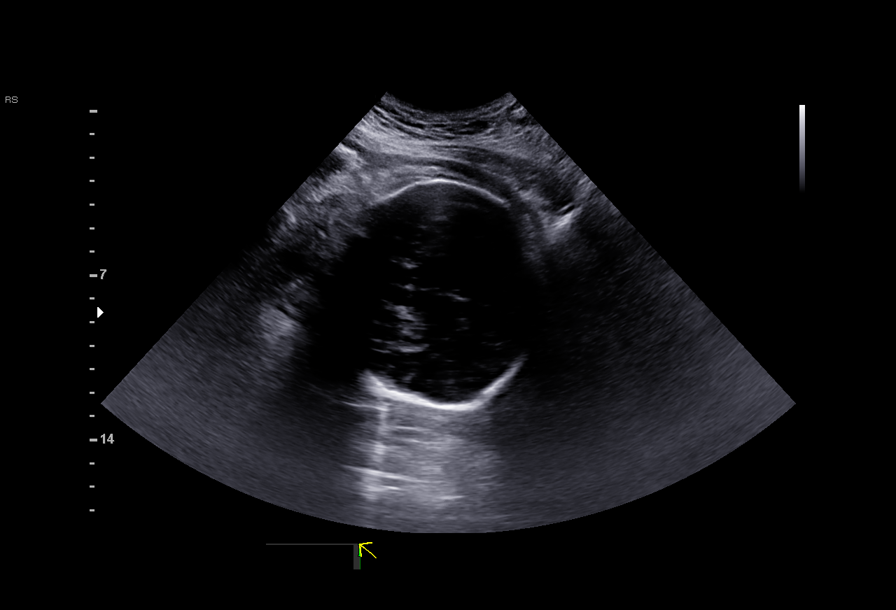
[im 25/27]
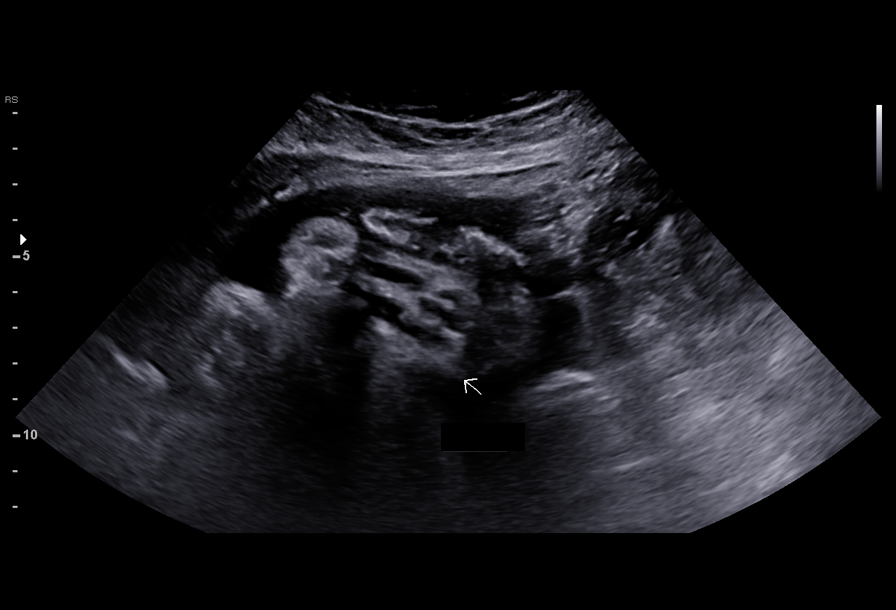
[im 27/27]
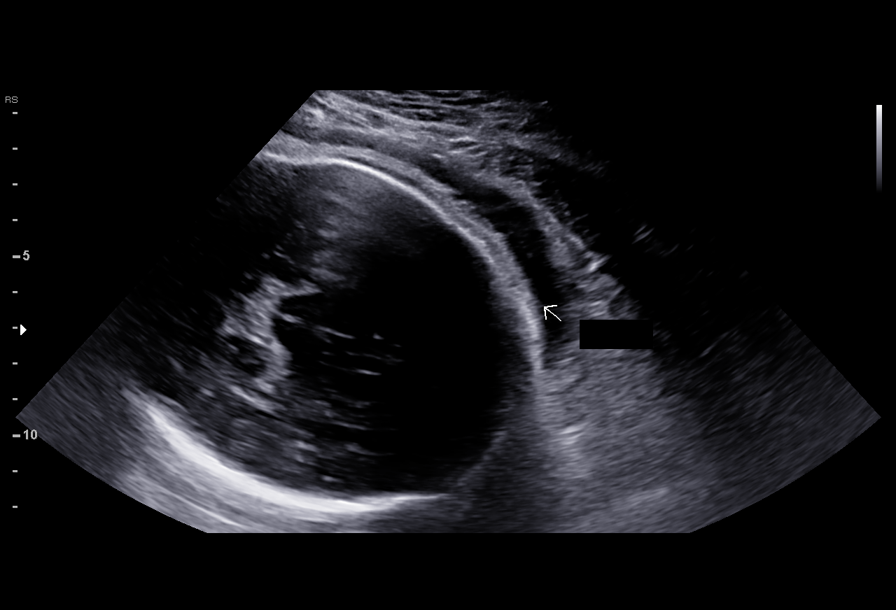

[14 of 27 positions shown; findings below may reference images not displayed]

MAU/Triage

Indications

40 weeks gestation of pregnancy
Postdate pregnancy (40-42 weeks)
OB History

Gravidity:    1
Fetal Evaluation

Num Of Fetuses:     1
Fetal Heart         150
Rate(bpm):
Cardiac Activity:   Observed
Presentation:       Cephalic

Amniotic Fluid
AFI FV:      Subjectively within normal limits

AFI Sum(cm)     %Tile       Largest Pocket(cm)
12.82           54

RUQ(cm)       RLQ(cm)       LUQ(cm)        LLQ(cm)
5.67
Biophysical Evaluation

Amniotic F.V:   Pocket => 2 cm two         F. Tone:         Observed
planes
F. Movement:    Observed                   Score:           [DATE]
F. Breathing:   Observed
Gestational Age

LMP:           40w 2d        Date:  04/29/15                 EDD:    02/03/16
Best:          40w 2d     Det. By:  LMP  (04/29/15)          EDD:    02/03/16
Impression

SIUP at 40+2 weeks
Cephalic presentation
Normal amniotic fluid volume
BPP [DATE]
Recommendations

Follow-up as clinically indicated

## 2017-10-11 ENCOUNTER — Encounter (HOSPITAL_COMMUNITY): Payer: Self-pay | Admitting: Emergency Medicine

## 2017-10-11 ENCOUNTER — Ambulatory Visit (HOSPITAL_COMMUNITY)
Admission: EM | Admit: 2017-10-11 | Discharge: 2017-10-11 | Disposition: A | Discharge status: Home / Self Care | Payer: BLUE CROSS/BLUE SHIELD | Attending: Family Medicine | Admitting: Family Medicine

## 2017-10-11 DIAGNOSIS — R21 Rash and other nonspecific skin eruption: Secondary | ICD-10-CM

## 2017-10-11 MED ORDER — MUPIROCIN 2 % EX OINT: 1.0000 "application " | TOPICAL_OINTMENT | Freq: Two times a day (BID) | CUTANEOUS | 0 refills | Status: DC

## 2017-10-11 MED ORDER — PREDNISONE 20 MG PO TABS: 40.0000 mg | ORAL_TABLET | Freq: Every day | ORAL | 0 refills | Status: AC

## 2017-10-11 NOTE — Discharge Instructions (Signed)
Continue prednisone as directed.  Bactroban ointment on affected area.  You can continue Benadryl at nighttime for itching, or takes other allergy medicine such as Zyrtec, Claritin, Allegra during the day.  Avoid using soap for now.  Warm compresses to the area.  Monitor for further changes in hygiene products, and please discontinue for now.  Avoid make-up for now.  Monitor for spreading redness, increased warmth, fever, pain, follow-up for reevaluation.

## 2017-10-11 NOTE — ED Provider Notes (Signed)
[facility]    CSN: [id number] Arrival date & time: 10/11/17  1658     History   Chief Complaint Chief Complaint  Patient presents with  . Rash    HPI Krista Merritt is a 27 y.o. female.   27 year old female comes in for 2-day history of rash to the face.  Denies spreading erythema, increased warmth, fever.  Denies pain, though slight irritation/itching.  States changed soap that she is using, though same brand.  Did put make-up on 3 days ago. Has been taking Benadryl without relief.  LMP 10/04/2017.  Denies acne during cycles.     Past Medical History:  Diagnosis Date  . Allergy    seasonal  . Eczema 10/22/2014  . Medical history non-contributory   . Obesity 10/22/2014  . Paresthesia 03/13/2016  . Preventative health care 11/04/2014    Patient Active Problem List   Diagnosis Date Noted  . Paresthesia 03/13/2016  . Pain in joint, lower leg 03/13/2016  . Edema 03/13/2016  . Postpartum care following vaginal delivery 02/08/2016  . Uterine contractions during pregnancy 02/06/2016  . Group B Streptococcus carrier, antepartum 01/16/2016  . Supervision of low-risk pregnancy 12/25/2015  . Allergic rhinitis 01/07/2015  . Preventative health care 11/04/2014  . Allergy   . Eczema 10/22/2014  . Obesity 10/22/2014    Past Surgical History:  Procedure Laterality Date  . COLPOSCOPY  2012   abnormal cells, cervix, roughly 2012, normal    OB History    Gravida  1   Para  1   Term  1   Preterm  0   AB  0   Living  1     SAB  0   TAB  0   Ectopic  0   Multiple  0   Live Births  1            Home Medications    Prior to Admission medications   Medication Sig Start Date End Date Taking? Authorizing Provider  mupirocin ointment (BACTROBAN) 2 % Apply 1 application topically 2 (two) times daily. 10/11/17   Cathie HoopsYu, Ayva Veilleux V, PA-C  predniSONE (DELTASONE) 20 MG tablet Take 2 tablets (40 mg total) by mouth daily for 5 days. 10/11/17 10/16/17  Belinda FisherYu, Anyely Cunning V,  PA-C    Family History Family History  Problem Relation Age of Onset  . Mental illness Father        suicide  . Diabetes Maternal Grandmother   . Asthma Maternal Grandmother   . COPD Paternal Grandmother        recurrent bronchitis  . Arthritis Paternal Grandmother     Social History Social History   Tobacco Use  . Smoking status: Never Smoker  . Smokeless tobacco: Never Used  Substance Use Topics  . Alcohol use: No    Alcohol/week: 0.0 oz    Comment: Ocassionally  . Drug use: No     Allergies   Patient has no known allergies.   Review of Systems Review of Systems  Reason unable to perform ROS: See HPI as above.     Physical Exam Triage Vital Signs ED Triage Vitals [10/11/17 1759]  Enc Vitals Group     BP 113/86     Pulse Rate 71     Resp 16     Temp 98.1 F (36.7 C)     Temp Source Oral     SpO2 100 %     Weight      Height  Head Circumference      Peak Flow      Pain Score 2     Pain Loc      Pain Edu?      Excl. in GC?    No data found.  Updated Vital Signs BP 113/86   Pulse 71   Temp 98.1 F (36.7 C) (Oral)   Resp 16   LMP 10/04/2017   SpO2 100%   Physical Exam  Constitutional: She is oriented to person, place, and time. She appears well-developed and well-nourished. No distress.  HENT:  Head: Normocephalic and atraumatic.  Eyes: Pupils are equal, round, and reactive to light. Conjunctivae are normal.  Neurological: She is alert and oriented to person, place, and time.  Skin: Skin is warm and dry. She is not diaphoretic.  See picture below.  No tenderness to palpation.           UC Treatments / Results  Labs (all labs ordered are listed, but only abnormal results are displayed) Labs Reviewed - No data to display  EKG None  Radiology No results found.  Procedures Procedures (including critical care time)  Medications Ordered in UC Medications - No data to display  Initial Impression / Assessment and Plan /  UC Course  I have reviewed the triage vital signs and the nursing notes.  Pertinent labs & imaging results that were available during my care of the patient were reviewed by me and considered in my medical decision making (see chart for details).    Discussed possible contact dermatitis causing symptoms.  Start prednisone as directed.  Warm compress.  Bactroban ointment on affected area.  Patient to refrain from using soap for now, and discontinue new hygiene products. Refrain from make-up use.  Return precautions given.  Patient expresses understanding and agrees to plan.  Final Clinical Impressions(s) / UC Diagnoses   Final diagnoses:  Rash    ED Prescriptions    Medication Sig Dispense Auth. Provider   predniSONE (DELTASONE) 20 MG tablet Take 2 tablets (40 mg total) by mouth daily for 5 days. 10 tablet Masato Pettie V, PA-C   mupirocin ointment (BACTROBAN) 2 % Apply 1 application topically 2 (two) times daily. 22 g Threasa Alpha, [place] 10/11/17 [phone]

## 2017-10-11 NOTE — ED Notes (Signed)
Pt discharged by provider.

## 2017-10-11 NOTE — ED Triage Notes (Signed)
PT has rash on face that started yesterday. No new products

## 2017-11-05 ENCOUNTER — Telehealth: Payer: Self-pay | Admitting: *Deleted

## 2017-11-05 NOTE — Telephone Encounter (Signed)
Please advise to patient that Dr. Abner GreenspanBlyth does not have any openings next week she will need to see another provider.

## 2017-11-05 NOTE — Telephone Encounter (Signed)
Copied from CRM [id number]#[id number]. Topic: Appointment Scheduling - Scheduling Inquiry for Clinic >> Nov 04, 2017  4:09 PM Maia PettiesOrtiz, Kristie S wrote: Reason for CRM: pt called stating she needs cpe next week, prefers Tuesday 8/27 or Wednesday 8/28. Please advise if Dr. Abner GreenspanBlyth can work in or pt is willing to see PA if approved. Please call pt.

## 2017-11-08 NOTE — Telephone Encounter (Signed)
Left message for pt to call us back to schedule appt with PA since Dr. Abner GreenspanBlyth does not have any openings.

## 2017-11-08 NOTE — Telephone Encounter (Signed)
Pt called and would like a call back.

## 2017-11-08 NOTE — Telephone Encounter (Signed)
Called patient back,left message stating that she would need to schedule with another provider in the office because Dr. Elby ShowersBlyths schedule is full per her CMA.

## 2017-11-10 ENCOUNTER — Encounter: Payer: Self-pay | Admitting: Internal Medicine

## 2017-11-10 ENCOUNTER — Ambulatory Visit (INDEPENDENT_AMBULATORY_CARE_PROVIDER_SITE_OTHER): Payer: Self-pay | Admitting: Internal Medicine

## 2017-11-10 VITALS — BP 106/68 | HR 86 | Temp 98.5°F | Resp 16 | Ht 66.0 in | Wt 276.0 lb

## 2017-11-10 DIAGNOSIS — Z0000 Encounter for general adult medical examination without abnormal findings: Secondary | ICD-10-CM

## 2017-11-10 NOTE — Assessment & Plan Note (Signed)
Td 2017 Female care per Dr Clearance CootsHarper, last OV per pt ~ 03/2017 See BMI, she is morbidly obese, extensive discussion about diet, exercise encouraged, and eventually if needed rec to be  Seen @ the wt loss clinic. Labs: CMP, TSH, CBC, A1c, FLP

## 2017-11-10 NOTE — Patient Instructions (Signed)
GO TO THE LAB : Get the blood work     GO TO THE FRONT DESK Schedule your next appointment for a physical exam with Dr. Abner GreenspanBlyth in 1 year.  Depending on your results, I might ask you to see her sooner.  As far as your weight: Try to exercise regularly  Try calorie counting, MYFITNESSPAl ??  Also consider see one of the weight loss clinics in town.  See information.

## 2017-11-10 NOTE — Progress Notes (Signed)
Subjective:    Patient ID: Krista Merritt, female    DOB: [birth year], 27 y.o.   MRN: [id number]  DOS:  11/10/2017 Type of visit - description : cpx Interval history: Patient of Dr. Kevin FentonBlithe.  Here for CPX. General feeling well. She knows she is overweight, likes to lose some weight.   Review of Systems  Specifically denies chest pain, cough, wheezing, palpitations or lower extremity edema. Periods are regular for the most part, not heavy   Other than above, a 14 point review of systems is negative    Past Medical History:  Diagnosis Date  . Allergy    seasonal  . Eczema 10/22/2014  . Medical history non-contributory   . Obesity 10/22/2014  . Paresthesia 03/13/2016  . Preventative health care 11/04/2014    Past Surgical History:  Procedure Laterality Date  . COLPOSCOPY  2012   abnormal cells, cervix, roughly 2012, normal    Social History   Socioeconomic History  . Marital status: Single    Spouse name: Not on file  . Number of children: Not on file  . Years of education: Not on file  . Highest education level: Not on file  Occupational History  . Occupation: Runner, broadcasting/film/videoteacher pre K  Social Needs  . Financial resource strain: Not on file  . Food insecurity:    Worry: Not on file    Inability: Not on file  . Transportation needs:    Medical: Not on file    Non-medical: Not on file  Tobacco Use  . Smoking status: Never Smoker  . Smokeless tobacco: Never Used  Substance and Sexual Activity  . Alcohol use: No    Alcohol/week: 0.0 standard drinks    Comment: Ocassionally  . Drug use: No  . 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 Physical activity:    Days per week: Not on file    Minutes per session: Not on file  . Stress: Not on file  Relationships  . Social connections:    Talks on phone: Not on file    Gets together: Not on file   Attends religious service: Not on file    Active member of club or organization: Not on file    Attends meetings of clubs or organizations: Not on file    Relationship status: Not on file  . Intimate partner violence:    Fear of current or ex partner: Not on file    Emotionally abused: Not on file    Physically abused: Not on file    Forced sexual activity: Not on file  Other Topics Concern  . Not on file  Social History Narrative  . Not on file     Family History  Problem Relation Age of Onset  . Mental illness Father        suicide  . Diabetes Maternal Grandmother   . Asthma Maternal Grandmother   . COPD Paternal Grandmother        recurrent bronchitis  . Arthritis Paternal Grandmother   . Colon cancer Neg Hx   . Breast cancer Neg Hx      Allergies as of 11/10/2017   No Known Allergies     Medication List        Accurate as of 11/10/17  2:25 PM. Always use your most recent med list.  mupirocin ointment 2 % Commonly known as:  BACTROBAN Apply 1 application topically 2 (two) times daily.          Objective:   Physical Exam BP 106/68 (BP Location: Left Arm, Patient Position: Sitting, Cuff Size: Large)   Pulse 86   Temp 98.5 F (36.9 C) (Oral)   Resp 16   Ht 5\' 6"  (1.676 m)   Wt 276 lb (125.2 kg)   LMP 10/02/2017   SpO2 98%   BMI 44.55 kg/m  General: Well developed, NAD, see BMI.  Neck: No  thyromegaly  HEENT:  Normocephalic . Face symmetric, atraumatic Lungs:  CTA B Normal respiratory effort, no intercostal retractions, no accessory muscle use. Heart: RRR,  no murmur.  No pretibial edema bilaterally  Abdomen:  Not distended, soft, non-tender. No rebound or rigidity.   Skin: Exposed areas without rash. Not pale. Not jaundice Neurologic:  alert & oriented X3.  Speech normal, gait appropriate for age and unassisted Strength symmetric and appropriate for age.  Psych: Cognition and judgment appear intact.  Cooperative with normal  attention span and concentration.  Behavior appropriate. No anxious or depressed appearing.     Assessment & Plan:   27 year old female healthy, not on BCP , presents here for a physical exam. Paperwork completed for her foster parent credentialing  RTC 1 year

## 2017-11-11 LAB — CBC
HCT: 40.8 % (ref 36.0–46.0)
Hemoglobin: 13 g/dL (ref 12.0–15.0)
MCHC: 31.9 g/dL (ref 30.0–36.0)
MCV: 70.1 fl — ABNORMAL LOW (ref 78.0–100.0)
Platelets: 284 10*3/uL (ref 150.0–400.0)
RBC: 5.82 Mil/uL — ABNORMAL HIGH (ref 3.87–5.11)
RDW: 15.7 % — AB (ref 11.5–15.5)
WBC: 6.5 10*3/uL (ref 4.0–10.5)

## 2017-11-11 LAB — COMPREHENSIVE METABOLIC PANEL
ALK PHOS: 42 U/L (ref 39–117)
ALT: 11 U/L (ref 0–35)
AST: 13 U/L (ref 0–37)
Albumin: 4 g/dL (ref 3.5–5.2)
BUN: 12 mg/dL (ref 6–23)
CO2: 28 mEq/L (ref 19–32)
Calcium: 9.4 mg/dL (ref 8.4–10.5)
Chloride: 105 mEq/L (ref 96–112)
Creatinine, Ser: 0.81 mg/dL (ref 0.40–1.20)
GFR: 107.86 mL/min (ref >60.00)
GLUCOSE: 71 mg/dL (ref 70–99)
POTASSIUM: 4.5 meq/L (ref 3.5–5.1)
SODIUM: 138 meq/L (ref 135–145)
TOTAL PROTEIN: 6.9 g/dL (ref 6.0–8.3)
Total Bilirubin: 0.4 mg/dL (ref 0.2–1.2)

## 2017-11-11 LAB — LIPID PANEL
Cholesterol: 158 mg/dL (ref 0–200)
HDL: 41.2 mg/dL (ref >39.00)
LDL CALC: 96 mg/dL (ref 0–99)
NONHDL: 116.5
Total CHOL/HDL Ratio: 4
Triglycerides: 102 mg/dL (ref 0.0–149.0)
VLDL: 20.4 mg/dL (ref 0.0–40.0)

## 2017-11-11 LAB — HEMOGLOBIN A1C: Hgb A1c MFr Bld: 5.9 % (ref 4.6–6.5)

## 2017-11-11 LAB — TSH: TSH: 0.73 u[IU]/mL (ref 0.35–4.50)

## 2018-08-31 ENCOUNTER — Ambulatory Visit (INDEPENDENT_AMBULATORY_CARE_PROVIDER_SITE_OTHER): Payer: Self-pay | Admitting: Medical

## 2018-08-31 DIAGNOSIS — L03019 Cellulitis of unspecified finger: Secondary | ICD-10-CM

## 2018-08-31 DIAGNOSIS — L089 Local infection of the skin and subcutaneous tissue, unspecified: Secondary | ICD-10-CM

## 2018-08-31 MED ORDER — AMOXICILLIN-POT CLAVULANATE 875-125 MG PO TABS: 1.0000 | ORAL_TABLET | Freq: Two times a day (BID) | ORAL | 0 refills | Status: DC

## 2018-08-31 NOTE — Patient Instructions (Signed)
You do appear to have had paronychia and then developed small abscess that burst. Concern for residual infection presently. Will rx augmentin and advise warm salt water soaks bid.  Follow up with pcp in 7 days or with  other provider if she is not available. Area did not look worrisome but video quality not the best today so want to make sure are getting better.  If worsening signs or symptoms/changes as discussed then schedule to be seen earlier in person.

## 2018-08-31 NOTE — Progress Notes (Signed)
   Subjective:    Patient ID: Krista Merritt, female    DOB: [birth year], 28 y.o.   MRN: [id number]  HPI  Virtual Visit via Video Note  I connected with Torunn E Wurtzel on 08/31/18 at  3:00 PM EDT by a video enabled telemedicine application and verified that I am speaking with the correct person using two identifiers.  Location: Patient: home Provider: home  lmp- October 04, 2018  Pt did not check bp today   I discussed the limitations of evaluation and management by telemedicine and the availability of in person appointments. The patient expressed understanding and agreed to proceed.  History of Present Illness:  Pt states some recent finger pain after hang nail. She picked at area and pealed back area with tooth. She states next couple of days area by nail got swollen and tender. Pt states 3 weeks on and  had the swelling. 2 days ago area came to head and burst. Now area feels better but not back to normal   Observations/Objective: General-no acute distress, pleasant, oriented. Lungs- on inspection lungs appear unlabored. Neck- no tracheal deviation or jvd on inspection. Neuro- gross motor function appears intact. Hand- finger- lateral aspect adjacent to nail mild swelling. Mild broken down skin. No dc. Slight hyperpigmentation. Nail does not look ingrown. Faint residual.(quality of video bit grainy and most of time definition was poor.   Assessment and Plan: You do appear to have had paronychia and then developed small abscess that burst. Concern for residual infection presently. Will rx augmentin and advise warm salt water soaks bid.  Follow up with pcp in 7 days or with  other provider if she is not available. Area did not look worrisome but video quality not the best today so want to make sure are getting better.  If worsening signs or symptoms/changes as discussed then schedule to be seen earlier in person.  Mackie Pai, PA-C  Follow Up Instructions:    I  discussed the assessment and treatment plan with the patient. The patient was provided an opportunity to ask questions and all were answered. The patient agreed with the plan and demonstrated an understanding of the instructions.   The patient was advised to call back or seek an in-person evaluation if the symptoms worsen or if the condition fails to improve as anticipated.  I provided 15  minutes of non-face-to-face time during this encounter.   Mackie Pai, PA-C    Review of Systems  Constitutional: Negative for chills, fatigue and fever.  Respiratory: Negative for cough, chest tightness and wheezing.   Cardiovascular: Negative for chest pain and palpitations.  Musculoskeletal:       See hpi.  Skin:       See hpi.       Objective:   Physical Exam        Assessment & Plan:

## 2018-12-24 ENCOUNTER — Emergency Department (HOSPITAL_COMMUNITY): Payer: Medicaid Other

## 2018-12-24 ENCOUNTER — Emergency Department (HOSPITAL_COMMUNITY)
Admission: EM | Admit: 2018-12-24 | Discharge: 2018-12-24 | Disposition: A | Discharge status: Home / Self Care | Payer: Medicaid Other | Attending: Emergency Medicine | Admitting: Emergency Medicine

## 2018-12-24 ENCOUNTER — Other Ambulatory Visit: Payer: Self-pay

## 2018-12-24 ENCOUNTER — Encounter (HOSPITAL_COMMUNITY): Payer: Self-pay

## 2018-12-24 DIAGNOSIS — R0789 Other chest pain: Secondary | ICD-10-CM | POA: Insufficient documentation

## 2018-12-24 DIAGNOSIS — I471 Supraventricular tachycardia: Secondary | ICD-10-CM | POA: Insufficient documentation

## 2018-12-24 DIAGNOSIS — R0602 Shortness of breath: Secondary | ICD-10-CM | POA: Insufficient documentation

## 2018-12-24 DIAGNOSIS — R1110 Vomiting, unspecified: Secondary | ICD-10-CM | POA: Insufficient documentation

## 2018-12-24 DIAGNOSIS — E876 Hypokalemia: Secondary | ICD-10-CM

## 2018-12-24 DIAGNOSIS — F1290 Cannabis use, unspecified, uncomplicated: Secondary | ICD-10-CM | POA: Insufficient documentation

## 2018-12-24 LAB — CBC WITH DIFFERENTIAL/PLATELET
Abs Immature Granulocytes: 0.03 10*3/uL (ref 0.00–0.07)
Basophils Absolute: 0 10*3/uL (ref 0.0–0.1)
Basophils Relative: 0 %
Eosinophils Absolute: 0 10*3/uL (ref 0.0–0.5)
Eosinophils Relative: 0 %
HCT: 41.4 % (ref 36.0–46.0)
Hemoglobin: 12.9 g/dL (ref 12.0–15.0)
Immature Granulocytes: 0 %
Lymphocytes Relative: 19 %
Lymphs Abs: 1.4 10*3/uL (ref 0.7–4.0)
MCH: 22.4 pg — ABNORMAL LOW (ref 26.0–34.0)
MCHC: 31.2 g/dL (ref 30.0–36.0)
MCV: 71.8 fL — ABNORMAL LOW (ref 80.0–100.0)
Monocytes Absolute: 0.5 10*3/uL (ref 0.1–1.0)
Monocytes Relative: 7 %
Neutro Abs: 5.4 10*3/uL (ref 1.7–7.7)
Neutrophils Relative %: 74 %
Platelets: 309 10*3/uL (ref 150–400)
RBC: 5.77 MIL/uL — ABNORMAL HIGH (ref 3.87–5.11)
RDW: 16.1 % — ABNORMAL HIGH (ref 11.5–15.5)
WBC: 7.3 10*3/uL (ref 4.0–10.5)
nRBC: 0 % (ref 0.0–0.2)

## 2018-12-24 LAB — BASIC METABOLIC PANEL
Anion gap: 12 (ref 5–15)
BUN: 12 mg/dL (ref 6–20)
CO2: 21 mmol/L — ABNORMAL LOW (ref 22–32)
Calcium: 9.2 mg/dL (ref 8.9–10.3)
Chloride: 106 mmol/L (ref 98–111)
Creatinine, Ser: 0.81 mg/dL (ref 0.44–1.00)
GFR calc Af Amer: >60 mL/min (ref >60)
GFR calc non Af Amer: >60 mL/min (ref >60)
Glucose, Bld: 109 mg/dL — ABNORMAL HIGH (ref 70–99)
Potassium: 3.3 mmol/L — ABNORMAL LOW (ref 3.5–5.1)
Sodium: 139 mmol/L (ref 135–145)

## 2018-12-24 LAB — I-STAT BETA HCG BLOOD, ED (MC, WL, AP ONLY): I-stat hCG, quantitative: <5 m[IU]/mL (ref <5)

## 2018-12-24 LAB — MAGNESIUM: Magnesium: 1.8 mg/dL (ref 1.7–2.4)

## 2018-12-24 MED ORDER — SODIUM CHLORIDE 0.9 % IV BOLUS
1000.0000 mL | Freq: Once | INTRAVENOUS | Status: AC
  Administered 2018-12-24: 20:00:00 1000 mL via INTRAVENOUS

## 2018-12-24 MED ORDER — POTASSIUM CHLORIDE CRYS ER 20 MEQ PO TBCR
40.0000 meq | EXTENDED_RELEASE_TABLET | Freq: Once | ORAL | Status: AC
  Administered 2018-12-24: 40 meq via ORAL
  Filled 2018-12-24: qty 2

## 2018-12-24 NOTE — ED Notes (Signed)
Patient verbalizes understanding of discharge instructions. Opportunity for questioning and answers were provided. Armband removed by staff, pt discharged from ED.  

## 2018-12-24 NOTE — ED Triage Notes (Signed)
Per [facility], pt from work w/ c/o a sudden onset of SVT. Pt reported to have chest discomfort, N/V, palpitations, dizziness/weakness, and jitteriness for an hour. HR noted to be 200 bpm and converted to sinus rhythm with 6 mg of Adenosine. All symptoms subsided after conversion.

## 2018-12-24 NOTE — Discharge Instructions (Signed)
If you develop recurrent, continued, or worsening chest pain, shortness of breath, fever, vomiting, abdominal or back pain, or any other new/concerning symptoms then return to the ER for evaluation.  

## 2018-12-24 NOTE — ED Provider Notes (Signed)
[facility] EMERGENCY DEPARTMENT Provider Note   CSN: [id number] Arrival date & time: 12/24/18  1903     History   Chief Complaint Chief Complaint  Patient presents with  . Tachycardia    HPI Krista Merritt is a 28 y.o. female.     HPI  28 year old female presents with SVT.  At around 4:30 PM, the patient started to feel anxious, chest pressure, shortness of breath and palpitations.  EMS was called and noted her to have a heart rate of around 200.  They gave her 6 mg adenosine.  This converted.  She now has complete resolution of her symptoms.  Has never had this before.  Rarely drinks caffeine.  Drinks alcohol 2 or 3 times per week.  Occasional marijuana. Vomited when this happened, otherwise has not been feeling ill.  Past Medical History:  Diagnosis Date  . Allergy    seasonal  . Eczema 10/22/2014  . Medical history non-contributory   . Obesity 10/22/2014  . Paresthesia 03/13/2016  . Preventative health care 11/04/2014    Patient Active Problem List   Diagnosis Date Noted  . Paresthesia 03/13/2016  . Pain in joint, lower leg 03/13/2016  . Edema 03/13/2016  . Postpartum care following vaginal delivery 02/08/2016  . Uterine contractions during pregnancy 02/06/2016  . Group B Streptococcus carrier, antepartum 01/16/2016  . Supervision of low-risk pregnancy 12/25/2015  . Allergic rhinitis 01/07/2015  . Preventative health care 11/04/2014  . Allergy   . Eczema 10/22/2014  . Obesity 10/22/2014    Past Surgical History:  Procedure Laterality Date  . COLPOSCOPY  2012   abnormal cells, cervix, roughly 2012, normal     OB History    Gravida  1   Para  1   Term  1   Preterm  0   AB  0   Living  1     SAB  0   TAB  0   Ectopic  0   Multiple  0   Live Births  1            Home Medications    Prior to Admission medications   Medication Sig Start Date End Date Taking? Authorizing Provider  amoxicillin-clavulanate  (AUGMENTIN) 875-125 MG tablet Take 1 tablet by mouth 2 (two) times daily. 08/31/18   Saguier, Ramon Dredge, PA-C    Family History Family History  Problem Relation Age of Onset  . Mental illness Father        suicide  . Diabetes Maternal Grandmother   . Asthma Maternal Grandmother   . COPD Paternal Grandmother        recurrent bronchitis  . Arthritis Paternal Grandmother   . Colon cancer Neg Hx   . Breast cancer Neg Hx     Social History Social History   Tobacco Use  . Smoking status: Never Smoker  . Smokeless tobacco: Never Used  Substance Use Topics  . Alcohol use: Yes    Alcohol/week: 3.0 standard drinks    Types: 3 Standard drinks or equivalent per week    Comment: occassionally   . Drug use: Yes    Types: Marijuana    Comment: occassionally     Allergies   Patient has no known allergies.   Review of Systems Review of Systems  Constitutional: Negative for fever.  Respiratory: Positive for shortness of breath.   Cardiovascular: Positive for chest pain and palpitations.  Gastrointestinal: Positive for vomiting. Negative for abdominal pain.  All other  systems reviewed and are negative.    Physical Exam Updated Vital Signs BP 109/75   Pulse 94   Temp 98 F (36.7 C) (Oral)   Resp 17   SpO2 100%   Physical Exam Vitals signs and nursing note reviewed.  Constitutional:      Appearance: She is well-developed. She is obese.  HENT:     Head: Normocephalic and atraumatic.     Right Ear: External ear normal.     Left Ear: External ear normal.     Nose: Nose normal.  Eyes:     General:        Right eye: No discharge.        Left eye: No discharge.  Cardiovascular:     Rate and Rhythm: Normal rate and regular rhythm.     Heart sounds: Normal heart sounds.  Pulmonary:     Effort: Pulmonary effort is normal.     Breath sounds: Normal breath sounds.  Abdominal:     Palpations: Abdomen is soft.     Tenderness: There is no abdominal tenderness.  Skin:     General: Skin is warm and dry.  Neurological:     Mental Status: She is alert.  Psychiatric:        Mood and Affect: Mood is not anxious.      ED Treatments / Results  Labs (all labs ordered are listed, but only abnormal results are displayed) Labs Reviewed  BASIC METABOLIC PANEL - Abnormal; Notable for the following components:      Result Value   Potassium 3.3 (*)    CO2 21 (*)    Glucose, Bld 109 (*)    All other components within normal limits  CBC WITH DIFFERENTIAL/PLATELET - Abnormal; Notable for the following components:   RBC 5.77 (*)    MCV 71.8 (*)    MCH 22.4 (*)    RDW 16.1 (*)    All other components within normal limits  MAGNESIUM  I-STAT BETA HCG BLOOD, ED ([facility], [facility], [facility] ONLY)    EKG EKG Interpretation  Date/Time:  Saturday December 24 2018 19:06:07 EDT Ventricular Rate:  91 PR Interval:    QRS Duration: 97 QT Interval:  364 QTC Calculation: 448 R Axis:   64 Text Interpretation:  Normal sinus rhythm Consider left atrial enlargement no acute ST/T changes No old tracing to compare Confirmed by Sherwood Gambler [phone]) on 12/24/2018 7:08:25 PM  EMS EKG    Radiology Dg Chest 2 View  Result Date: 12/24/2018 CLINICAL DATA:  SVT. EXAM: CHEST - 2 VIEW COMPARISON:  None. FINDINGS: The heart size and mediastinal contours are within normal limits. Both lungs are clear. The visualized skeletal structures are unremarkable. IMPRESSION: No active cardiopulmonary disease. Electronically Signed   By: Titus Dubin M.D.   On: 12/24/2018 20:07    Procedures Procedures (including critical care time)  Medications Ordered in ED Medications  sodium chloride 0.9 % bolus 1,000 mL (0 mLs Intravenous Stopped 12/24/18 2026)  potassium chloride SA (KLOR-CON) CR tablet 40 mEq (40 mEq Oral Given 12/24/18 2025)     Initial Impression / Assessment and Plan / ED Course  I have reviewed the triage vital signs and the nursing notes.  Pertinent labs & imaging results that were  available during my care of the patient were reviewed by me and considered in my medical decision making (see chart for details).        Patient is asymptomatic here and feels fine.  SVT has  not recurred.  Mild hypokalemia repleted in the emergency department.  Otherwise all of her chest symptoms went away with the SVT resolution so my concern for ACS or other acute cardiac pathology is pretty low.  We discussed Valsalva maneuvers.  Follow-up with cardiology.  Discussed return precautions.  Final Clinical Impressions(s) / ED Diagnoses   Final diagnoses:  SVT (supraventricular tachycardia) (HCC)  Hypokalemia    ED Discharge Orders    None       Pricilla LovelessGoldston, Durga Saldarriaga, MD 12/24/18 2029

## 2019-01-03 ENCOUNTER — Ambulatory Visit (INDEPENDENT_AMBULATORY_CARE_PROVIDER_SITE_OTHER): Payer: Medicaid Other | Admitting: Cardiovascular Disease

## 2019-01-03 ENCOUNTER — Encounter: Payer: Self-pay | Admitting: Cardiovascular Disease

## 2019-01-03 ENCOUNTER — Other Ambulatory Visit: Payer: Self-pay

## 2019-01-03 DIAGNOSIS — I471 Supraventricular tachycardia: Secondary | ICD-10-CM

## 2019-01-03 NOTE — Assessment & Plan Note (Signed)
Krista Merritt  was referred to me by the ER for an episode of PSVT that occurred on 12/24/2018.  She had no prior episodes.  She was seen by EMS at that time had a heart rate of approximately 200.  She converted fairly quickly with 6 mg of Adenocard to sinus rhythm.  She said no further episodes.  She denies excessive alcohol or caffeine intake.  She does have anxiety and was under a lot of stress that day because of her job.  This point, I do not feel compelled to pursue any further diagnostic work-up including echo or an event monitor.  If she has recurrent symptoms we will further investigate.

## 2019-01-03 NOTE — Progress Notes (Signed)
01/03/2019 [name]   1990/06/13  [id number]  Primary Physician Mosie Lukes, MD Primary Cardiologist: Lorretta Harp MD Lupe Carney, [place]  HPI:  Krista Merritt is a 28 y.o. severely overweight single African-American female mother of 1 child who works as a Paediatric nurse.  She was referred by the emergency room, Dr. Verner Chol, because a recent episode of symptomatic PSVT.  She has no risk factors of heart disease.  She does not smoke.  She drinks occasionally.  She does not use recreational recreational drugs or drink caffeine.  She was making up to people's faces for a wedding and developed tach tachypalpitations which would not subside spontaneously.  She called EMS who found her to be in PSVT at a rate of 200.  She responded to 6 mg of IV adenosine and converted to sinus rhythm.  Her serum potassium was 3.3.  She has had no prior or subsequent episodes.   No outpatient medications have been marked as taking for the 01/03/19 encounter (Office Visit) with Lorretta Harp, MD.     No Known Allergies  Social History   Socioeconomic History  . Marital status: Single    Spouse name: Not on file  . Number of children: 1  . Years of education: Not on file  . Highest education level: Not on file  Occupational History  . Occupation: Manufacturing engineer  Social Needs  . Financial resource strain: Not on file  . Food insecurity    Worry: Not on file    Inability: Not on file  . Transportation needs    Medical: Not on file    Non-medical: Not on file  Tobacco Use  . Smoking status: Never Smoker  . Smokeless tobacco: Never Used  Substance and Sexual Activity  . Alcohol use: Yes    Alcohol/week: 3.0 standard drinks    Types: 3 Standard drinks or equivalent per week    Comment: occassionally   . Drug use: Yes    Types: Marijuana    Comment: occassionally  . 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 Physical activity    Days per week: Not on file    Minutes per session: Not on file  . Stress: Not on file  Relationships  . Social Herbalist on phone: Not on file    Gets together: Not on file    Attends religious service: Not on file    Active member of club or organization: Not on file    Attends meetings of clubs or organizations: Not on file    Relationship status: Not on file  . Intimate partner violence    Fear of current or ex partner: Not on file    Emotionally abused: Not on file    Physically abused: Not on file    Forced sexual activity: Not on file  Other Topics Concern  . Not on file  Social History Narrative   G1P1   Household: pt, her mother and her child      Review of Systems: General: negative for chills, fever, night sweats or weight changes.  Cardiovascular: negative for chest pain, dyspnea on exertion, edema, orthopnea, palpitations, paroxysmal nocturnal dyspnea or shortness of breath Dermatological: negative for rash Respiratory: negative for cough or wheezing Urologic: negative for hematuria Abdominal: negative for nausea, vomiting,  diarrhea, bright red blood per rectum, melena, or hematemesis Neurologic: negative for visual changes, syncope, or dizziness All other systems reviewed and are otherwise negative except as noted above.    Blood pressure 124/76, pulse 83, temperature (!) 97.2 F (36.2 C), height 5\' 6"  (1.676 m), weight 282 lb (127.9 kg).  General appearance: alert and no distress Neck: no adenopathy, no carotid bruit, no JVD, supple, symmetrical, trachea midline and thyroid not enlarged, symmetric, no tenderness/mass/nodules Lungs: clear to auscultation bilaterally Heart: regular rate and rhythm, S1, S2 normal, no murmur, click, rub or gallop Extremities: extremities normal, atraumatic, no cyanosis or edema Pulses: 2+ and symmetric Skin: Skin color,  texture, turgor normal. No rashes or lesions Neurologic: Alert and oriented X 3, normal strength and tone. Normal symmetric reflexes. Normal coordination and gait  EKG not performed today  ASSESSMENT AND PLAN:   PSVT (paroxysmal supraventricular tachycardia) [facility]) Mr. Shingler  was referred to me by the ER for an episode of PSVT that occurred on 12/24/2018.  She had no prior episodes.  She was seen by EMS at that time had a heart rate of approximately 200.  She converted fairly quickly with 6 mg of Adenocard to sinus rhythm.  She said no further episodes.  She denies excessive alcohol or caffeine intake.  She does have anxiety and was under a lot of stress that day because of her job.  This point, I do not feel compelled to pursue any further diagnostic work-up including echo or an event monitor.  If she has recurrent symptoms we will further investigate.      02/23/2019 MD FACP,FACC,FAHA, [facility] 01/03/2019 3:32 PM

## 2019-01-03 NOTE — Patient Instructions (Signed)
Medication Instructions:  Your physician recommends that you continue on your current medications as directed. Please refer to the Current Medication list given to you today. If you need a refill on your cardiac medications before your next appointment, please call your pharmacy.   Lab work: none If you have labs (blood work) drawn today and your tests are completely normal, you will receive your results only by: . MyChart Message (if you have MyChart) OR . A paper copy in the mail If you have any lab test that is abnormal or we need to change your treatment, we will call you to review the results.  Testing/Procedures: none  Follow-Up: At [facility], you and your health needs are our priority.  As part of our continuing mission to provide you with exceptional heart care, we have created designated Provider Care Teams.  These Care Teams include your primary Cardiologist (physician) and Advanced Practice Providers (APPs -  Physician Assistants and Nurse Practitioners) who all work together to provide you with the care you need, when you need it. You will need a follow up appointment in 6 months with Dr. Jonathan Berry.  Please call our office 2 months in advance to schedule this appointment.       

## 2019-01-20 ENCOUNTER — Ambulatory Visit (INDEPENDENT_AMBULATORY_CARE_PROVIDER_SITE_OTHER): Payer: Self-pay | Admitting: Family Medicine

## 2019-01-20 ENCOUNTER — Other Ambulatory Visit: Payer: Self-pay

## 2019-01-20 ENCOUNTER — Encounter: Payer: Self-pay | Admitting: Family Medicine

## 2019-01-20 VITALS — BP 120/70 | HR 81 | Temp 97.5°F | Resp 18 | Ht 66.0 in | Wt 282.6 lb

## 2019-01-20 DIAGNOSIS — N611 Abscess of the breast and nipple: Secondary | ICD-10-CM

## 2019-01-20 MED ORDER — DOXYCYCLINE HYCLATE 100 MG PO TABS: 100.0000 mg | ORAL_TABLET | Freq: Two times a day (BID) | ORAL | 0 refills | Status: DC

## 2019-01-20 NOTE — Progress Notes (Signed)
Patient ID: Krista Merritt, female    DOB: 22-Jul-1990  Age: 28 y.o. MRN: [id number]    Subjective:  Subjective  HPI Krista Merritt presents for abscess under L breast.  She has been putting warm compresses on it   Review of Systems  Constitutional: Negative for chills and fever.  HENT: Negative for congestion and hearing loss.   Eyes: Negative for discharge.  Respiratory: Negative for cough and shortness of breath.   Cardiovascular: Negative for chest pain, palpitations and leg swelling.  Gastrointestinal: Negative for abdominal pain, blood in stool, constipation, diarrhea, nausea and vomiting.  Genitourinary: Negative for dysuria, frequency, hematuria and urgency.  Musculoskeletal: Negative for back pain and myalgias.  Skin: Positive for wound. Negative for rash.  Allergic/Immunologic: Negative for environmental allergies.  Neurological: Negative for dizziness, weakness and headaches.  Hematological: Does not bruise/bleed easily.  Psychiatric/Behavioral: Negative for suicidal ideas. The patient is not nervous/anxious.     History Past Medical History:  Diagnosis Date  . Allergy    seasonal  . Eczema 10/22/2014  . Medical history non-contributory   . Obesity 10/22/2014  . Paresthesia 03/13/2016  . Preventative health care 11/04/2014    She has a past surgical history that includes Colposcopy (2012).   Her family history includes Arthritis in her paternal grandmother; Asthma in her maternal grandmother; COPD in her paternal grandmother; Diabetes in her maternal grandmother; Mental illness in her father.She reports that she has never smoked. She has never used smokeless tobacco. She reports current alcohol use of about 3.0 standard drinks of alcohol per week. She reports current drug use. Drug: Marijuana.  No current outpatient medications on file prior to visit.   No current facility-administered medications on file prior to visit.      Objective:  Objective   Physical Exam Vitals signs and nursing note reviewed.  Constitutional:      Appearance: She is well-developed.  HENT:     Head: Normocephalic and atraumatic.  Eyes:     Conjunctiva/sclera: Conjunctivae normal.  Neck:     Musculoskeletal: Normal range of motion and neck supple.     Thyroid: No thyromegaly.     Vascular: No carotid bruit or JVD.  Cardiovascular:     Rate and Rhythm: Normal rate and regular rhythm.     Heart sounds: Normal heart sounds. No murmur.  Pulmonary:     Effort: Pulmonary effort is normal. No respiratory distress.     Breath sounds: Normal breath sounds. No wheezing or rales.  Chest:     Chest wall: No tenderness.  Skin:      Neurological:     Mental Status: She is alert and oriented to person, place, and time.    BP 120/70 (BP Location: Right Arm, Patient Position: Sitting, Cuff Size: Large)   Pulse 81   Temp (!) 97.5 F (36.4 C) (Temporal)   Resp 18   Ht 5\' 6"  (1.676 m)   Wt 282 lb 9.6 oz (128.2 kg)   SpO2 98%   BMI 45.61 kg/m  Wt Readings from Last 3 Encounters:  01/20/19 282 lb 9.6 oz (128.2 kg)  01/03/19 282 lb (127.9 kg)  11/10/17 276 lb (125.2 kg)     Lab Results  Component Value Date   WBC 7.3 12/24/2018   HGB 12.9 12/24/2018   HCT 41.4 12/24/2018   PLT 309 12/24/2018   GLUCOSE 109 (H) 12/24/2018   CHOL 158 11/10/2017   TRIG 102.0 11/10/2017   HDL 41.20 11/10/2017  LDLCALC 96 11/10/2017   ALT 11 11/10/2017   AST 13 11/10/2017   NA 139 12/24/2018   K 3.3 (L) 12/24/2018   CL 106 12/24/2018   CREATININE 0.81 12/24/2018   BUN 12 12/24/2018   CO2 21 (L) 12/24/2018   TSH 0.73 11/10/2017   HGBA1C 5.9 11/10/2017    Dg Chest 2 View  Result Date: 12/24/2018 CLINICAL DATA:  SVT. EXAM: CHEST - 2 VIEW COMPARISON:  None. FINDINGS: The heart size and mediastinal contours are within normal limits. Both lungs are clear. The visualized skeletal structures are unremarkable. IMPRESSION: No active cardiopulmonary disease.  Electronically Signed   By: Obie Dredge M.D.   On: 12/24/2018 20:07     Assessment & Plan:  Plan  I am having [name] start on doxycycline.  Meds ordered this encounter  Medications  . doxycycline (VIBRA-TABS) 100 MG tablet    Sig: Take 1 tablet (100 mg total) by mouth 2 (two) times daily.    Dispense:  20 tablet    Refill:  0    Problem List Items Addressed This Visit    None    Visit Diagnoses    Left breast abscess    -  Primary   Relevant Medications   doxycycline (VIBRA-TABS) 100 MG tablet   Other Relevant Orders   Wound culture     con't with warm compresses abx per orders  Culture done  rto prn   Follow-up: Return if symptoms worsen or fail to improve.  Donato Schultz, DO

## 2019-01-20 NOTE — Patient Instructions (Signed)

## 2019-01-23 LAB — WOUND CULTURE
MICRO NUMBER:: 1073277
SPECIMEN QUALITY:: ADEQUATE

## 2019-08-15 ENCOUNTER — Encounter: Payer: Medicaid Other | Admitting: Family Medicine

## 2019-08-19 ENCOUNTER — Ambulatory Visit: Payer: Medicaid Other | Attending: Internal Medicine

## 2019-08-19 DIAGNOSIS — Z23 Encounter for immunization: Secondary | ICD-10-CM

## 2019-08-19 NOTE — Progress Notes (Signed)
   Covid-19 Vaccination Clinic  Name:  Krista Merritt    MRN: [id number] DOB: 04-06-1990  08/19/2019  Krista Merritt was observed post Covid-19 immunization for 15 minutes without incident. She was provided with Vaccine Information Sheet and instruction to access the V-Safe system.   Krista Merritt was instructed to call 911 with any severe reactions post vaccine: Marland Kitchen Difficulty breathing  . Swelling of face and throat  . A fast heartbeat  . A bad rash all over body  . Dizziness and weakness   Immunizations Administered    Name Date Dose VIS Date Route   Pfizer COVID-19 Vaccine 08/19/2019 11:54 AM 0.3 mL 05/10/2018 Intramuscular   Manufacturer: [employer], [employer]   Lot: [id number]   NDC: [address]-0

## 2019-09-11 ENCOUNTER — Ambulatory Visit: Payer: Medicaid Other | Attending: Internal Medicine

## 2019-10-17 IMAGING — DX DG CHEST 2V
2 series · 2 of 2 positions shown · non-contrast
Comparison: None.

CLINICAL DATA: SVT.

EXAM:
CHEST - 2 VIEW

[chest pa]
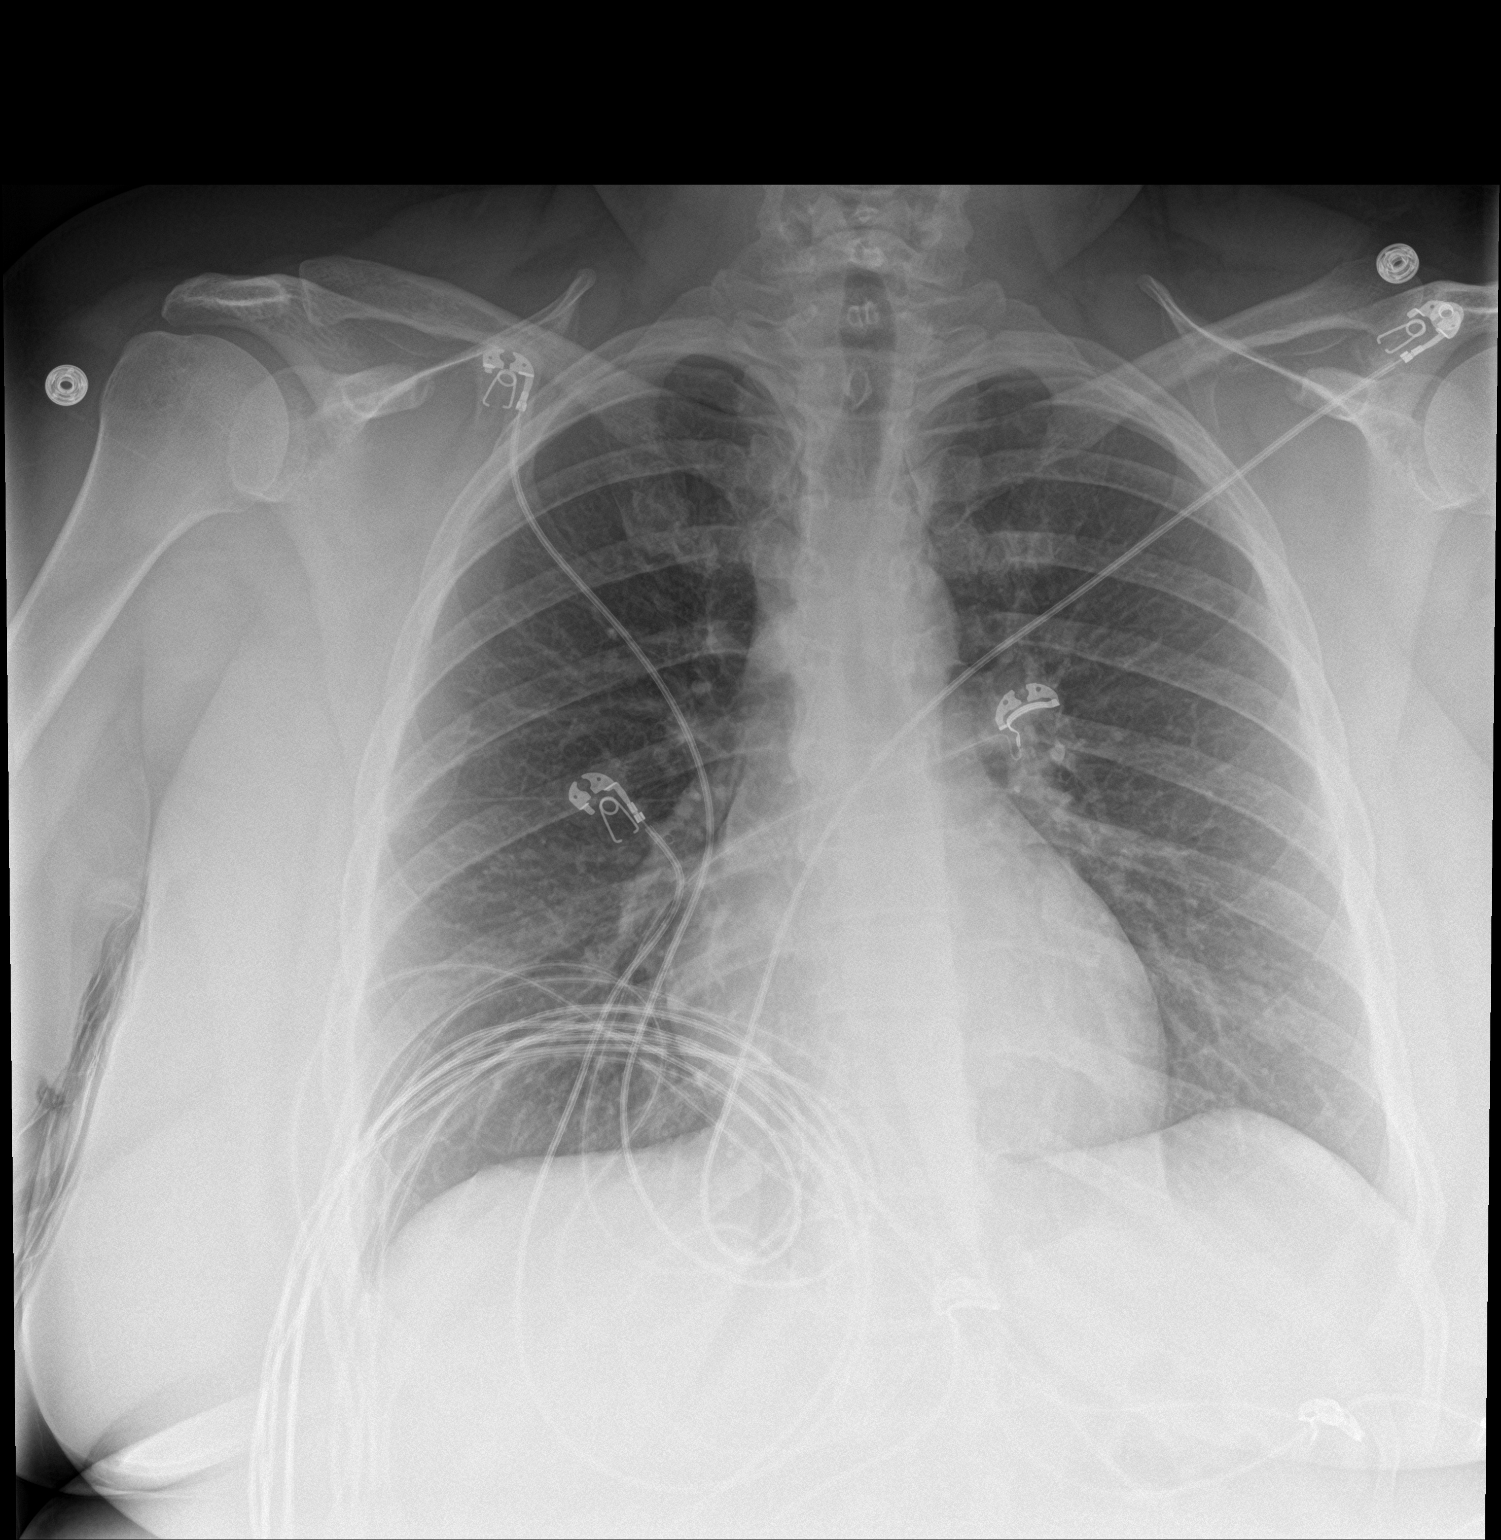

[chest lat]
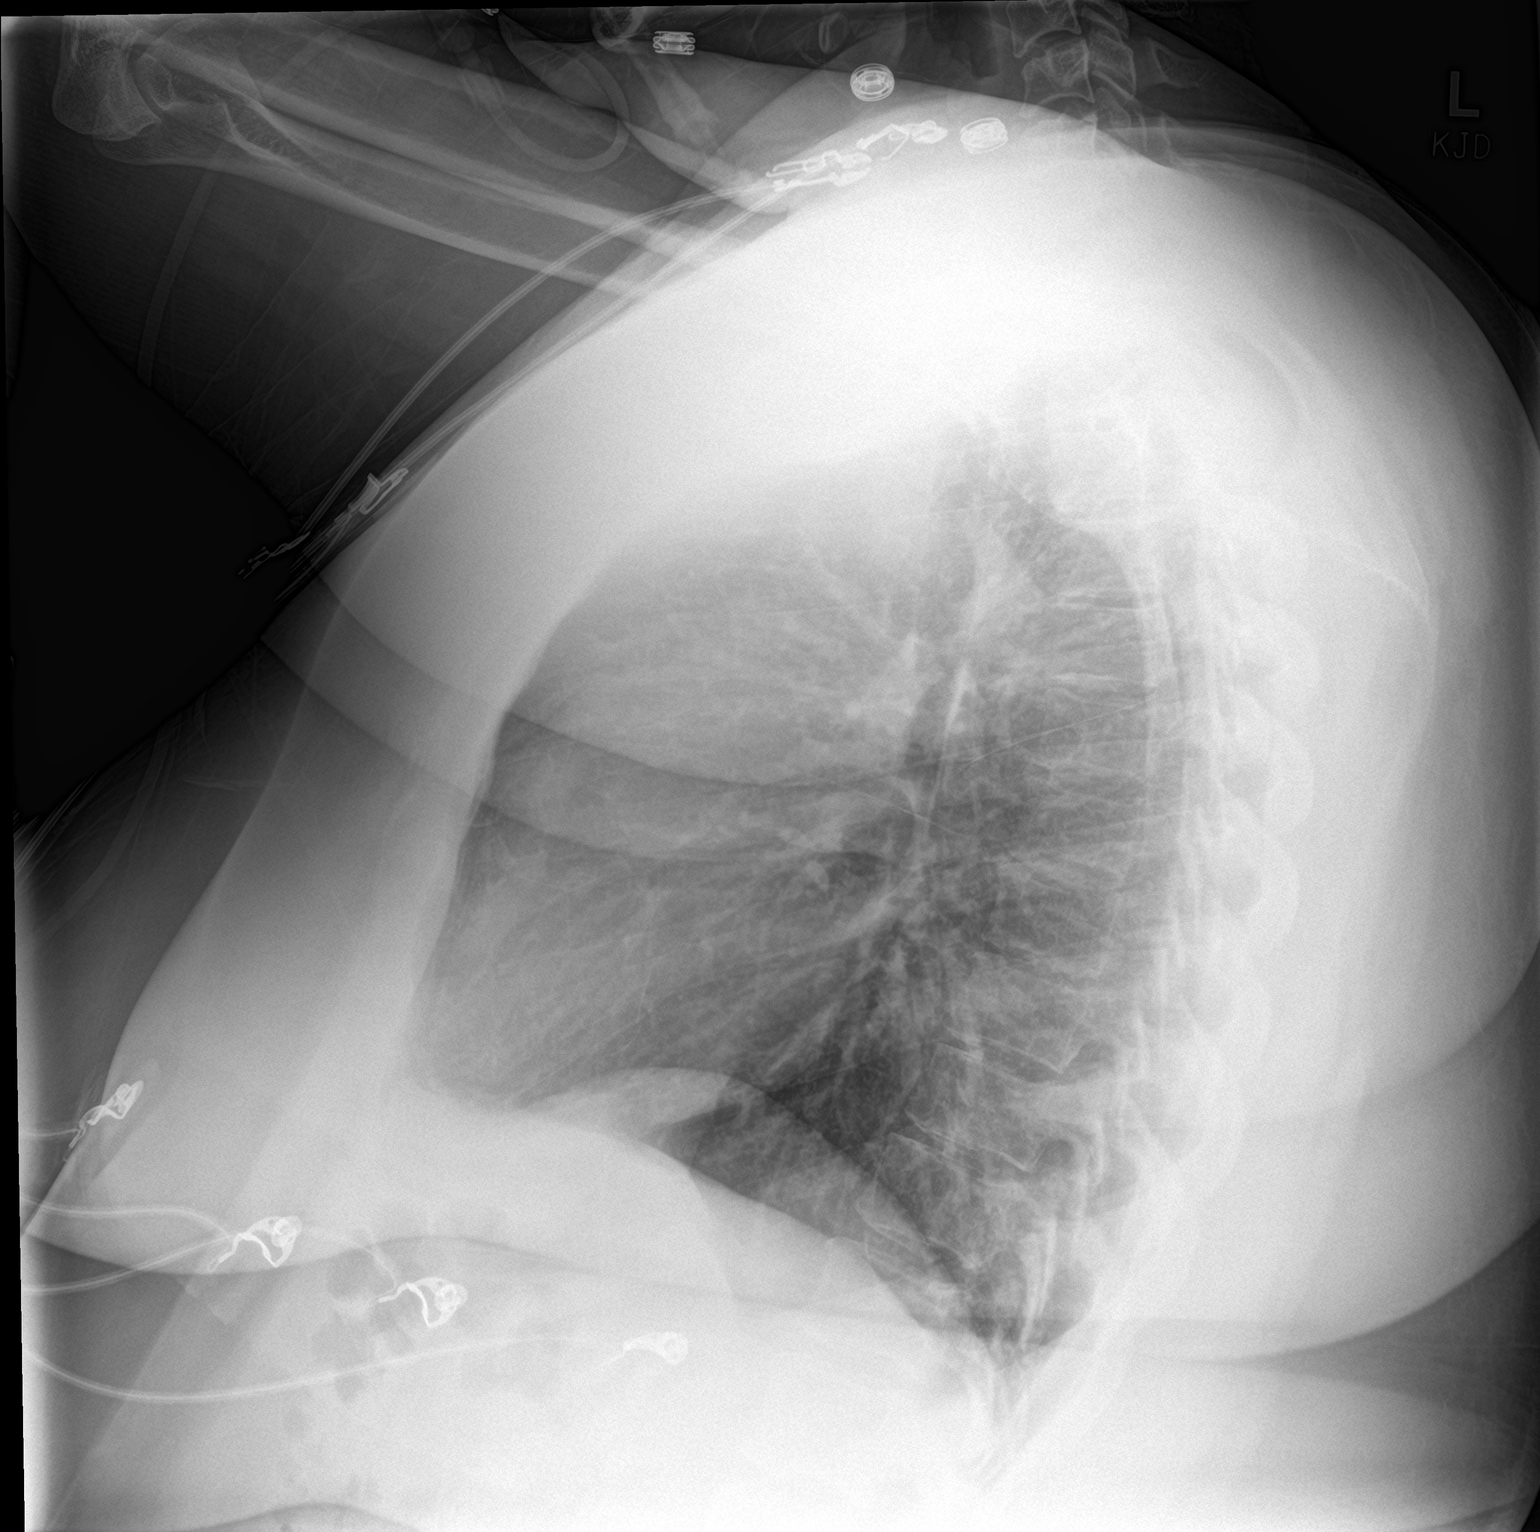

[2 of 2 positions shown; findings below may reference images not displayed]

FINDINGS: The heart size and mediastinal contours are within normal limits.
Both lungs are clear. The visualized skeletal structures are
unremarkable.
IMPRESSION: No active cardiopulmonary disease.

## 2019-11-10 ENCOUNTER — Other Ambulatory Visit: Payer: Self-pay

## 2019-11-10 ENCOUNTER — Ambulatory Visit (INDEPENDENT_AMBULATORY_CARE_PROVIDER_SITE_OTHER): Payer: 59 | Admitting: Medical

## 2019-11-10 ENCOUNTER — Encounter: Payer: Self-pay | Admitting: Medical

## 2019-11-10 VITALS — BP 117/76 | HR 79 | Temp 98.2°F | Resp 16 | Ht 66.0 in | Wt 289.4 lb

## 2019-11-10 DIAGNOSIS — L989 Disorder of the skin and subcutaneous tissue, unspecified: Secondary | ICD-10-CM

## 2019-11-10 DIAGNOSIS — Z111 Encounter for screening for respiratory tuberculosis: Secondary | ICD-10-CM | POA: Diagnosis not present

## 2019-11-10 DIAGNOSIS — Z0000 Encounter for general adult medical examination without abnormal findings: Secondary | ICD-10-CM

## 2019-11-10 DIAGNOSIS — Z0001 Encounter for general adult medical examination with abnormal findings: Secondary | ICD-10-CM

## 2019-11-10 NOTE — Addendum Note (Signed)
Addended by: Mervin Kung A on: 11/10/2019 11:59 AM   Modules accepted: Orders

## 2019-11-10 NOTE — Addendum Note (Signed)
Addended by: Gwenevere Abbot on: 11/10/2019 11:57 AM   Modules accepted: Orders

## 2019-11-10 NOTE — Progress Notes (Signed)
Pre visit review using our clinic review tool, if applicable. No additional management support is needed unless otherwise documented below in the visit note. 

## 2019-11-10 NOTE — Patient Instructions (Addendum)
For you wellness exam today I have ordered cbc, cmp and  lipid panel.   Vaccine tdap declined today.   Recommend exercise and healthy diet.  We will let you know lab results as they come in.  Follow up date appointment will be determined after lab review.   For weight loss recommend try [employer] app.    Preventive Care 64-30 Years Old, Female Preventive care refers to visits with your health care provider and lifestyle choices that can promote health and wellness. This includes:  A yearly physical exam. This may also be called an annual well check.  Regular dental visits and eye exams.  Immunizations.  Screening for certain conditions.  Healthy lifestyle choices, such as eating a healthy diet, getting regular exercise, not using drugs or products that contain nicotine and tobacco, and limiting alcohol use. What can I expect for my preventive care visit? Physical exam Your health care provider will check your:  Height and weight. This may be used to calculate body mass index (BMI), which tells if you are at a healthy weight.  Heart rate and blood pressure.  Skin for abnormal spots. Counseling Your health care provider may ask you questions about your:  Alcohol, tobacco, and drug use.  Emotional well-being.  Home and relationship well-being.  Sexual activity.  Eating habits.  Work and work Statistician.  Method of birth control.  Menstrual cycle.  Pregnancy history. What immunizations do I need?  Influenza (flu) vaccine  This is recommended every year. Tetanus, diphtheria, and pertussis (Tdap) vaccine  You may need a Td booster every 10 years. Varicella (chickenpox) vaccine  You may need this if you have not been vaccinated. Human papillomavirus (HPV) vaccine  If recommended by your health care provider, you may need three doses over 6 months. Measles, mumps, and rubella (MMR) vaccine  You may need at least one dose of MMR. You may also need a second  dose. Meningococcal conjugate (MenACWY) vaccine  One dose is recommended if you are age 109-21 years and a first-year college student living in a residence hall, or if you have one of several medical conditions. You may also need additional booster doses. Pneumococcal conjugate (PCV13) vaccine  You may need this if you have certain conditions and were not previously vaccinated. Pneumococcal polysaccharide (PPSV23) vaccine  You may need one or two doses if you smoke cigarettes or if you have certain conditions. Hepatitis A vaccine  You may need this if you have certain conditions or if you travel or work in places where you may be exposed to hepatitis A. Hepatitis B vaccine  You may need this if you have certain conditions or if you travel or work in places where you may be exposed to hepatitis B. Haemophilus influenzae type b (Hib) vaccine  You may need this if you have certain conditions. You may receive vaccines as individual doses or as more than one vaccine together in one shot (combination vaccines). Talk with your health care provider about the risks and benefits of combination vaccines. What tests do I need?  Blood tests  Lipid and cholesterol levels. These may be checked every 5 years starting at age 91.  Hepatitis C test.  Hepatitis B test. Screening  Diabetes screening. This is done by checking your blood sugar (glucose) after you have not eaten for a while (fasting).  Sexually transmitted disease (STD) testing.  BRCA-related cancer screening. This may be done if you have a family history of breast, ovarian, tubal, or  peritoneal cancers.  Pelvic exam and Pap test. This may be done every 3 years starting at age 47. Starting at age 38, this may be done every 5 years if you have a Pap test in combination with an HPV test. Talk with your health care provider about your test results, treatment options, and if necessary, the need for more tests. Follow these instructions at  home: Eating and drinking   Eat a diet that includes fresh fruits and vegetables, whole grains, lean protein, and low-fat dairy.  Take vitamin and mineral supplements as recommended by your health care provider.  Do not drink alcohol if: ? Your health care provider tells you not to drink. ? You are pregnant, may be pregnant, or are planning to become pregnant.  If you drink alcohol: ? Limit how much you have to 0-1 drink a day. ? Be aware of how much alcohol is in your drink. In the [place] one drink equals one 12 oz bottle of beer (355 mL), one 5 oz glass of wine (148 mL), or one 1 oz glass of hard liquor (44 mL). Lifestyle  Take daily care of your teeth and gums.  Stay active. Exercise for at least 30 minutes on 5 or more days each week.  Do not use any products that contain nicotine or tobacco, such as cigarettes, e-cigarettes, and chewing tobacco. If you need help quitting, ask your health care provider.  If you are sexually active, practice safe sex. Use a condom or other form of birth control (contraception) in order to prevent pregnancy and STIs (sexually transmitted infections). If you plan to become pregnant, see your health care provider for a preconception visit. What's next?  Visit your health care provider once a year for a well check visit.  Ask your health care provider how often you should have your eyes and teeth checked.  Stay up to date on all vaccines. This information is not intended to replace advice given to you by your health care provider. Make sure you discuss any questions you have with your health care provider. Document Revised: 11/11/2017 Document Reviewed: 11/11/2017 Elsevier Patient Education  2020 [employer].

## 2019-11-10 NOTE — Progress Notes (Signed)
Subjective:    Patient ID: Krista Merritt, female    DOB: 11-11-1990, 29 y.o.   MRN: [id number]  HPI Pt here for cpe.  Pt student at [employer]. Exercises twice a week. States moderate healthy diet. On review of chart expressed desire to loose some weight. Pt is considering weight loss surgery.    Pt has had covid vaccines. 2 shot phizer.  Last pap one year ago with gyn. Told normal.     Review of Systems  Constitutional: Negative for chills, fatigue and fever.  HENT: Negative for congestion.   Respiratory: Negative for cough, chest tightness, shortness of breath and wheezing.   Cardiovascular: Negative for chest pain and palpitations.  Gastrointestinal: Negative for abdominal pain, diarrhea, nausea and rectal pain.  Genitourinary: Negative for dysuria.  Musculoskeletal: Negative for back pain and neck pain.  Skin: Negative for rash.  Neurological: Negative for dizziness, speech difficulty, weakness, light-headedness, numbness and headaches.  Hematological: Negative for adenopathy. Does not bruise/bleed easily.  Psychiatric/Behavioral: Negative for behavioral problems, decreased concentration, sleep disturbance and suicidal ideas. The patient is not nervous/anxious.    Past Medical History:  Diagnosis Date  . Allergy    seasonal  . Eczema 10/22/2014  . Medical history non-contributory   . Obesity 10/22/2014  . Paresthesia 03/13/2016  . Preventative health care 11/04/2014     Social History   Socioeconomic History  . Marital status: Single    Spouse name: Not on file  . Number of children: 1  . Years of education: Not on file  . Highest education level: Not on file  Occupational History  . Occupation: Printmaker  Tobacco Use  . Smoking status: Never Smoker  . Smokeless tobacco: Never Used  Vaping Use  . Vaping Use: Never used  Substance and Sexual Activity  . Alcohol use: Yes    Alcohol/week: 3.0 standard drinks    Types: 3  Standard drinks or equivalent per week    Comment: occassionally   . Drug use: Yes    Types: Marijuana    Comment: occassionally  . 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 Not on file  Social History Narrative   G1P1   Household: pt, her mother and her child    Social Determinants of Corporate investment banker Strain:   . Difficulty of Paying Living Expenses: Not on file  Food Insecurity:   . Worried About Programme researcher, broadcasting/film/video in the Last Year: Not on file  . Ran Out of Food in the Last Year: Not on file  Transportation Needs:   . Lack of Transportation (Medical): Not on file  . Lack of Transportation (Non-Medical): Not on file  Physical Activity:   . Days of Exercise per Week: Not on file  . Minutes of Exercise per Session: Not on file  Stress:   . Feeling of Stress : Not on file  Social Connections:   . Frequency of Communication with Friends and Family: Not on file  . Frequency of Social Gatherings with Friends and Family: Not on file  . Attends Religious Services: Not on file  . Active Member of Clubs or Organizations: Not on file  . Attends Banker Meetings: Not on file  . Marital Status: Not on file  Intimate Partner Violence:   . Fear of Current  or Ex-Partner: Not on file  . Emotionally Abused: Not on file  . Physically Abused: Not on file  . Sexually Abused: Not on file    Past Surgical History:  Procedure Laterality Date  . COLPOSCOPY  2012   abnormal cells, cervix, roughly 2012, normal    Family History  Problem Relation Age of Onset  . Mental illness Father        suicide  . Diabetes Maternal Grandmother   . Asthma Maternal Grandmother   . COPD Paternal Grandmother        recurrent bronchitis  . Arthritis Paternal Grandmother   . Colon cancer Neg Hx   . Breast cancer Neg Hx     No Known  Allergies  Current Outpatient Medications on File Prior to Visit  Medication Sig Dispense Refill  . doxycycline (VIBRA-TABS) 100 MG tablet Take 1 tablet (100 mg total) by mouth 2 (two) times daily. (Patient not taking: Reported on 11/10/2019) 20 tablet 0   No current facility-administered medications on file prior to visit.    BP 117/76 (BP Location: Left Arm, Patient Position: Sitting, Cuff Size: Normal)   Pulse 79   Temp 98.2 F (36.8 C) (Oral)   Resp 16   Ht 5\' 6"  (1.676 m)   Wt 289 lb 6 oz (131.3 kg)   LMP 10/23/2019 (Approximate)   SpO2 96%   BMI 46.71 kg/m       Objective:   Physical Exam  General Mental Status- Alert. General Appearance- Not in acute distress.   Skin General: Color- Normal Color. Moisture- Normal Moisture. Left side scalp.- moderate sized dark growth about 10 mm wide and raised.  Neck Carotid Arteries- Normal color. Moisture- Normal Moisture. No carotid bruits. No JVD.  Chest and Lung Exam Auscultation: Breath Sounds:-Normal.  Cardiovascular Auscultation:Rythm- Regular. Murmurs & Other Heart Sounds:Auscultation of the heart reveals- No Murmurs.  Abdomen Inspection:-Inspeection Normal. Palpation/Percussion:Note:No mass. Palpation and Percussion of the abdomen reveal- Non Tender, Non Distended + BS, no rebound or guarding.    Neurologic Cranial Nerve exam:- CN III-XII intact(No nystagmus), symmetric smile. Strength:- 5/5 equal and symmetric strength both upper and lower extremities.      Assessment & Plan:  For you wellness exam today I have ordered cbc, cmp and  lipid panel.  Filled out [employer] SS form. No answeres on TB screen. No test not indicated.  Vaccine tdap declined today.   Recommend exercise and healthy diet.  We will let you know lab results as they come in.  Follow up date appointment will be determined after lab review.   For weight loss recommend try 12/23/2019 app.   [employer], PA-C

## 2019-11-11 LAB — COMPREHENSIVE METABOLIC PANEL
AG Ratio: 1.5 (calc) (ref 1.0–2.5)
ALT: 12 U/L (ref 6–29)
AST: 13 U/L (ref 10–30)
Albumin: 4.1 g/dL (ref 3.6–5.1)
Alkaline phosphatase (APISO): 43 U/L (ref 31–125)
BUN: 11 mg/dL (ref 7–25)
CO2: 26 mmol/L (ref 20–32)
Calcium: 9.5 mg/dL (ref 8.6–10.2)
Chloride: 105 mmol/L (ref 98–110)
Creat: 0.74 mg/dL (ref 0.50–1.10)
Globulin: 2.7 g/dL (calc) (ref 1.9–3.7)
Glucose, Bld: 83 mg/dL (ref 65–99)
Potassium: 4.4 mmol/L (ref 3.5–5.3)
Sodium: 137 mmol/L (ref 135–146)
Total Bilirubin: 0.4 mg/dL (ref 0.2–1.2)
Total Protein: 6.8 g/dL (ref 6.1–8.1)

## 2019-11-11 LAB — CBC WITH DIFFERENTIAL/PLATELET
Absolute Monocytes: 614 cells/uL (ref 200–950)
Basophils Absolute: 30 cells/uL (ref 0–200)
Basophils Relative: 0.4 %
Eosinophils Absolute: 178 cells/uL (ref 15–500)
Eosinophils Relative: 2.4 %
HCT: 39.8 % (ref 35.0–45.0)
Hemoglobin: 12.5 g/dL (ref 11.7–15.5)
Lymphs Abs: 2649 cells/uL (ref 850–3900)
MCH: 22.4 pg — ABNORMAL LOW (ref 27.0–33.0)
MCHC: 31.4 g/dL — ABNORMAL LOW (ref 32.0–36.0)
MCV: 71.5 fL — ABNORMAL LOW (ref 80.0–100.0)
MPV: 10.8 fL (ref 7.5–12.5)
Monocytes Relative: 8.3 %
Neutro Abs: 3929 cells/uL (ref 1500–7800)
Neutrophils Relative %: 53.1 %
Platelets: 325 10*3/uL (ref 140–400)
RBC: 5.57 10*6/uL — ABNORMAL HIGH (ref 3.80–5.10)
RDW: 15.1 % — ABNORMAL HIGH (ref 11.0–15.0)
Total Lymphocyte: 35.8 %
WBC: 7.4 10*3/uL (ref 3.8–10.8)

## 2019-11-11 LAB — LIPID PANEL
Cholesterol: 171 mg/dL (ref <200)
HDL: 42 mg/dL — ABNORMAL LOW (ref >=50)
LDL Cholesterol (Calc): 111 mg/dL (calc) — ABNORMAL HIGH
Non-HDL Cholesterol (Calc): 129 mg/dL (calc) (ref <130)
Total CHOL/HDL Ratio: 4.1 (calc) (ref <5.0)
Triglycerides: 87 mg/dL (ref <150)

## 2021-09-29 ENCOUNTER — Ambulatory Visit
Admission: RE | Admit: 2021-09-29 | Discharge: 2021-09-29 | Disposition: A | Discharge status: Home / Self Care | Payer: Medicaid Other | Source: Ambulatory Visit | Attending: Emergency Medicine | Admitting: Emergency Medicine

## 2021-09-29 VITALS — BP 140/91 | HR 76 | Temp 98.7°F | Resp 18

## 2021-09-29 DIAGNOSIS — R21 Rash and other nonspecific skin eruption: Secondary | ICD-10-CM

## 2021-09-29 MED ORDER — PREDNISONE 10 MG (21) PO TBPK: ORAL_TABLET | ORAL | 0 refills | Status: DC

## 2021-09-29 MED ORDER — TRIAMCINOLONE ACETONIDE 0.1 % EX CREA: 1.0000 | TOPICAL_CREAM | Freq: Two times a day (BID) | CUTANEOUS | 0 refills | Status: DC

## 2021-09-29 NOTE — Discharge Instructions (Signed)
Take tapered steroid as directed. You can apply topical steroid to affected areas twice daily.

## 2021-09-29 NOTE — ED Triage Notes (Signed)
Patient presents to Urgent Care with complaints of rash that started on her elbow but has since spread to her abdomin and neck since 3 days ago. Patient reports multiple medications.

## 2021-09-29 NOTE — ED Provider Notes (Signed)
Patient Contact: 11:19 AM (approximate)   History   Rash   HPI  Krista Merritt is a 31 y.o. female presents to the urgent care with a papular rash along right elbow, chest and upper back.  Patient reports that rash has become more prominent over the past 24 hours.  Patient states that she does have a history of eczema but states it is never looked this severe.  No new contact exposures to her knowledge.  No tick bites.  She has not started any new medications recently.      Physical Exam   Triage Vital Signs: ED Triage Vitals  Enc Vitals Group     BP 09/29/21 1109 (!) 140/91     Pulse Rate 09/29/21 1110 76     Resp 09/29/21 1109 18     Temp 09/29/21 1109 98.7 F (37.1 C)     Temp src --      SpO2 09/29/21 1109 98 %     Weight --      Height --      Head Circumference --      Peak Flow --      Pain Score 09/29/21 1108 5     Pain Loc --      Pain Edu? --      Excl. in GC? --     Most recent vital signs: Vitals:   09/29/21 1109 09/29/21 1110  BP: (!) 140/91   Pulse:  76  Resp: 18   Temp: 98.7 F (37.1 C)   SpO2: 98%      General: Alert and in no acute distress. Eyes:  PERRL. EOMI. Head: No acute traumatic findings ENT:      Ears:       Nose: No congestion/rhinnorhea.      Mouth/Throat: Mucous membranes are moist. Neck: No stridor. No cervical spine tenderness to palpation. Cardiovascular:  Good peripheral perfusion Respiratory: Normal respiratory effort without tachypnea or retractions. Lungs CTAB. Good air entry to the bases with no decreased or absent breath sounds. Gastrointestinal: Bowel sounds 4 quadrants. Soft and nontender to palpation. No guarding or rigidity. No palpable masses. No distention. No CVA tenderness. Musculoskeletal: Full range of motion to all extremities.  Neurologic:  No gross focal neurologic deficits are appreciated.  Skin: Patient has flesh-colored, papular rash that appears dry and nonvesicular along right elbow, chest  and low back. Other:   ED Results / Procedures / Treatments   Labs (all labs ordered are listed, but only abnormal results are displayed) Labs Reviewed - No data to display     PROCEDURES:  Critical Care performed: No  Procedures   MEDICATIONS ORDERED IN ED: Medications - No data to display   IMPRESSION / MDM / ASSESSMENT AND PLAN / ED COURSE  I reviewed the triage vital signs and the nursing notes.                             Assessment and plan:  Nonspecific skin eruption Differential diagnosis includes eczema, pityriasis rosea, contact dermatitis....  We will treat with tapered prednisone and topical triamcinolone and have patient follow-up with primary care as needed.  Patient denies history of diabetes.   FINAL CLINICAL IMPRESSION(S) / ED DIAGNOSES   Final diagnoses:  Rash and nonspecific skin eruption     Rx / DC Orders   ED Discharge Orders          Ordered  predniSONE (STERAPRED UNI-PAK 21 TAB) 10 MG (21) TBPK tablet        09/29/21 1118    triamcinolone cream (KENALOG) 0.1 %  2 times daily        09/29/21 1118             Note:  This document was prepared using Dragon voice recognition software and may include unintentional dictation errors.   Pia Mau [place], [place] 09/29/21 1121

## 2021-12-05 ENCOUNTER — Inpatient Hospital Stay: Admission: RE | Admit: 2021-12-05 | Payer: Medicaid Other | Source: Ambulatory Visit

## 2022-01-21 ENCOUNTER — Ambulatory Visit
Admission: RE | Admit: 2022-01-21 | Discharge: 2022-01-21 | Disposition: A | Discharge status: Home / Self Care | Payer: Self-pay | Source: Ambulatory Visit | Attending: Internal Medicine | Admitting: Internal Medicine

## 2022-01-21 ENCOUNTER — Ambulatory Visit (INDEPENDENT_AMBULATORY_CARE_PROVIDER_SITE_OTHER): Payer: Self-pay

## 2022-01-21 DIAGNOSIS — M542 Cervicalgia: Secondary | ICD-10-CM

## 2022-01-21 DIAGNOSIS — R519 Headache, unspecified: Secondary | ICD-10-CM

## 2022-01-21 MED ORDER — CYCLOBENZAPRINE HCL 5 MG PO TABS: 5.0000 mg | ORAL_TABLET | Freq: Two times a day (BID) | ORAL | 0 refills | Status: DC | PRN

## 2022-01-21 MED ORDER — KETOROLAC TROMETHAMINE 30 MG/ML IJ SOLN
30.0000 mg | Freq: Once | INTRAMUSCULAR | Status: AC
  Administered 2022-01-21: 30 mg via INTRAMUSCULAR

## 2022-01-21 MED ORDER — DEXAMETHASONE SODIUM PHOSPHATE 10 MG/ML IJ SOLN
10.0000 mg | Freq: Once | INTRAMUSCULAR | Status: AC
  Administered 2022-01-21: 10 mg via INTRAMUSCULAR
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
## 2022-01-21 NOTE — Discharge Instructions (Addendum)
Your neck x-ray was negative for fracture or dislocation.  There are some chronic changes noted on x-ray that I recommend following up with PCP for. you have been given two injections in urgent care today to decrease pain and inflammation. Do not take any ibuprofen, advil, aleve, excedrin for at least 24 hours following injection. I have also prescribed a muscle relaxer that you can take as need. Please be advised that this muscle relaxer can cause drowsiness so do not drive or drink alcohol while taking it. Follow up if symptoms persist or worsen.

## 2022-02-14 NOTE — Telephone Encounter (Signed)
NA

## 2022-09-03 ENCOUNTER — Encounter: Payer: Medicaid Other | Admitting: Obstetrics

## 2023-08-17 ENCOUNTER — Encounter: Payer: Self-pay | Admitting: Student

## 2023-08-17 DIAGNOSIS — Z7689 Persons encountering health services in other specified circumstances: Secondary | ICD-10-CM | POA: Insufficient documentation

## 2023-08-17 DIAGNOSIS — Z8742 Personal history of other diseases of the female genital tract: Secondary | ICD-10-CM | POA: Insufficient documentation

## 2023-08-17 NOTE — Progress Notes (Unsigned)
 No chief complaint on file.      New Patient Visit SUBJECTIVE: HPI: Krista Merritt is an 33 y.o.female who is being seen for establishing care.  The patient was previously seen at ***.   PMHx:*** (PMH:  CA, CVD, HTN, CVA, DM, ETOH, Thyroid )***  Records Obtained: Yes OR No records today, In process***  Prior PCP:  PSx-( Tubal Lititagion, Cholesctectmy, appendectomy)?  Specialists:  Meds:   Taking *** for ***  Compliance: Patient states they are/***are not compliant with medications.  FH: CA, MI/CAD, HTN, CVA, DM, ETOH, Mental Illness, Thyroid  Dz  SH:  Employed:         Employer:  Relationship:         Sexual partners:  Resides with:  Education: (highest level):  Habits: ETOH:  Y/N   # Drinks per week         SA: Yes/ N/A ***            Smoking:  (Y/N)  ***PPD  Allergies:  Refills?  Patient denies fever, chills, SOB, CP, palpitations, dyspnea, edema, HA, vision changes, N/V/D, abdominal pain, urinary symptoms, rash, weight changes, and recent illness or hospitalizations.    Past Medical History:  Diagnosis Date   Allergy    seasonal   Eczema 10/22/2014   Medical history non-contributory    Obesity 10/22/2014   Paresthesia 03/13/2016   Preventative health care 11/04/2014   Past Surgical History:  Procedure Laterality Date   COLPOSCOPY  2012   abnormal cells, cervix, roughly 2012, normal   Family History  Problem Relation Age of Onset   Mental illness Father        suicide   Diabetes Maternal Grandmother    Asthma Maternal Grandmother    COPD Paternal Grandmother        recurrent bronchitis   Arthritis Paternal Grandmother    Colon cancer Neg Hx    Breast cancer Neg Hx    No Known Allergies  Current Outpatient Medications:    cyclobenzaprine  (FLEXERIL ) 5 MG tablet, Take 1 tablet (5 mg total) by mouth 2 (two) times daily as needed for muscle spasms., Disp: 20 tablet, Rfl: 0   doxycycline  (VIBRA -TABS) 100 MG tablet, Take 1 tablet (100 mg total) by  mouth 2 (two) times daily. (Patient not taking: Reported on 11/10/2019), Disp: 20 tablet, Rfl: 0   predniSONE  (STERAPRED UNI-PAK 21 TAB) 10 MG (21) TBPK tablet, Take 6 tablets the first day, take 5 tablets the second day, take 4 tablets the third day, take 3 tablets the fourth day, take 2 tablets the fifth day, take 1 tablet the sixth day., Disp: 21 tablet, Rfl: 0   triamcinolone  cream (KENALOG ) 0.1 %, Apply 1 Application topically 2 (two) times daily., Disp: 30 g, Rfl: 0  OBJECTIVE: There were no vitals taken for this visit. General:  well developed, well nourished, in no apparent distress Skin:  no significant moles, warts, or growths Nose:  nares patent, septum midline, mucosa normal Throat/Pharynx:  lips and gingiva without lesion; tongue and uvula midline; non-inflamed pharynx; no exudates or postnasal drainage Lungs:  clear to auscultation, breath sounds equal bilaterally, no respiratory distress Cardio:  regular rate and rhythm, no LE edema or bruits Musculoskeletal:  symmetrical muscle groups noted without atrophy or deformity Neuro:  gait normal Psych: well oriented with normal range of affect and appropriate judgment/insight  ASSESSMENT/PLAN: No diagnosis found.  Patient instructed to sign release of records form from their previous PCP. Patient should return ***. The  patient voiced understanding and agreement to the plan.   Jackqueline Mason, DNP, AGNP-C 08/17/23

## 2023-08-18 ENCOUNTER — Encounter: Payer: Self-pay | Admitting: Student

## 2023-08-18 ENCOUNTER — Ambulatory Visit (INDEPENDENT_AMBULATORY_CARE_PROVIDER_SITE_OTHER): Admitting: Student

## 2023-08-18 ENCOUNTER — Other Ambulatory Visit (HOSPITAL_COMMUNITY)
Admission: RE | Admit: 2023-08-18 | Discharge: 2023-08-18 | Disposition: A | Discharge status: Home / Self Care | Source: Ambulatory Visit | Attending: Student | Admitting: Student

## 2023-08-18 VITALS — BP 116/78 | HR 65 | Temp 98.4°F | Resp 12 | Ht 66.0 in | Wt 266.8 lb

## 2023-08-18 DIAGNOSIS — E66813 Obesity, class 3: Secondary | ICD-10-CM

## 2023-08-18 DIAGNOSIS — Z6841 Body Mass Index (BMI) 40.0 and over, adult: Secondary | ICD-10-CM | POA: Diagnosis not present

## 2023-08-18 DIAGNOSIS — Z7689 Persons encountering health services in other specified circumstances: Secondary | ICD-10-CM

## 2023-08-18 DIAGNOSIS — N898 Other specified noninflammatory disorders of vagina: Secondary | ICD-10-CM

## 2023-08-18 DIAGNOSIS — Z8742 Personal history of other diseases of the female genital tract: Secondary | ICD-10-CM

## 2023-08-18 NOTE — Assessment & Plan Note (Signed)
 Check gonorrhea chlamydia trichomoniasis and BV.  Review results and treat as necessary.

## 2023-08-18 NOTE — Patient Instructions (Signed)

## 2023-08-18 NOTE — Assessment & Plan Note (Addendum)
 Requested records for most recent Pap.  Referred sent to GYN to update Pap, discuss contraception options.  Reports having irregular periods despite COC- further evaluation by gynecology.

## 2023-08-19 ENCOUNTER — Encounter (INDEPENDENT_AMBULATORY_CARE_PROVIDER_SITE_OTHER): Payer: Self-pay

## 2023-08-20 ENCOUNTER — Ambulatory Visit: Payer: Self-pay | Admitting: Student

## 2023-08-20 DIAGNOSIS — N760 Acute vaginitis: Secondary | ICD-10-CM

## 2023-08-20 LAB — CERVICOVAGINAL ANCILLARY ONLY
Bacterial Vaginitis (gardnerella): POSITIVE — AB
Candida Glabrata: NEGATIVE
Candida Vaginitis: NEGATIVE
Chlamydia: NEGATIVE
Comment: NEGATIVE
Comment: NEGATIVE
Comment: NEGATIVE
Comment: NEGATIVE
Comment: NEGATIVE
Comment: NORMAL
Neisseria Gonorrhea: NEGATIVE
Trichomonas: NEGATIVE

## 2023-08-20 MED ORDER — METRONIDAZOLE 0.75 % VA GEL: 1.0000 | Freq: Every day | VAGINAL | 0 refills | Status: AC

## 2023-09-13 ENCOUNTER — Encounter (INDEPENDENT_AMBULATORY_CARE_PROVIDER_SITE_OTHER): Payer: Self-pay

## 2023-09-21 DIAGNOSIS — Z0000 Encounter for general adult medical examination without abnormal findings: Secondary | ICD-10-CM | POA: Insufficient documentation

## 2023-09-21 NOTE — Progress Notes (Deleted)
 Subjective:     Patient ID: Krista Merritt, female    DOB: [birth year], 33 y.o.   MRN: [id number]  No chief complaint on file.   HPI  Patient presents for annual physical.  Any questions or concerns today? (Skip to "sick visits" for OLDCART HPI)  Krista Merritt a 33 y.o.  female/female***presents today for a complete physical exam. Pt reports consuming a {diet types:17450} diet. {types:19826} Pt generally feels {DESC; WELL/FAIRLY WELL/POORLY:18703}. Reports sleeping {DESC; WELL/FAIRLY WELL/POORLY:18703}.  {does/does not:200015} have additional problems to discuss today.   New Surgeries or Family Hx: ***Yes/ Denies Habits: ETOH, illicit drugs, smoking, secondhand exposure, caffeine   LMP:  Incassia 0.35 mg daily, compliant   HCM: -Pap: follows with GYN - Immunizations: Hep B, HPV, Tdap   Patient denies fever, chills, SOB, CP, palpitations, dyspnea, edema, HA, vision changes, N/V/D, abdominal pain, urinary symptoms, rash, weight changes, and recent illness or hospitalizations.     History of Present Illness              Health Maintenance Due  Topic Date Due   DTaP/Tdap/Td (6 - Tdap) 04/30/2000   Hepatitis B Vaccines (1 of 3 - 19+ 3-dose series) Never done   HPV VACCINES (1 - 3-dose SCDM series) Never done   Cervical Cancer Screening (HPV/Pap Cotest)  05/01/2019    Past Medical History:  Diagnosis Date   Allergy    seasonal   Eczema 10/22/2014   Medical history non-contributory    Obesity 10/22/2014   Paresthesia 03/13/2016   Preventative health care 11/04/2014    Past Surgical History:  Procedure Laterality Date   COLPOSCOPY  2012   abnormal cells, cervix, roughly 2012, normal    Family History  Problem Relation Age of Onset   Hypertension Mother    Mental illness Father        suicide   Asthma Maternal Grandmother    COPD Paternal Grandmother        recurrent bronchitis   Arthritis Paternal Grandmother    Kidney failure Paternal  Grandmother    Heart attack Paternal Grandmother    Dementia Paternal Grandfather    Colon cancer Neg Hx    Breast cancer Neg Hx     Social History   Socioeconomic History   Marital status: Single    Spouse name: Not on file   Number of children: 1   Years of education: Not on file   Highest education level: Bachelor's degree (e.g., BA, AB, BS)  Occupational History   Occupation: Printmaker  Tobacco Use   Smoking status: Never   Smokeless tobacco: Never  Vaping Use   Vaping status: Never Used  Substance and Sexual Activity   Alcohol use: Yes    Alcohol/week: 3.0 standard drinks of alcohol    Types: 3 Standard drinks or equivalent per week    Comment: occassionally    Drug use: Not Currently    Types: Marijuana    Comment: occassionally   Sexual activity: Yes    Partners: Male    Birth control/protection: None, Pill    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, preschool teacher  Other Topics Concern   Not on file  Social History Narrative   G1P1   Household: pt, her mother and her child    83 year old boy [place]--- as of 2025   Social Drivers of Corporate investment banker Strain: Not on file  Food Insecurity: No Food Insecurity (04/04/2020)   Received  [facility]   Hunger Vital Sign    Within the past 12 months, you worried that your food would run out before you got the money to buy more.: Never true    Within the past 12 months, the food you bought just didn't last and you didn't have money to get more.: Never true  Transportation Needs: Not on file  Physical Activity: Not on file  Stress: Not on file  Social Connections: Unknown (07/22/2021)   [facility]   Social Network    Social Network: Not on file  Intimate Partner Violence: Unknown (06/18/2021)   Received from [facility]   HITS    Physically Hurt: Not on file    Insult or Talk Down To: Not on file    Threaten Physical Harm: Not on file    Scream or Curse: Not  on file    Outpatient Medications Prior to Visit  Medication Sig Dispense Refill   INCASSIA 0.35 MG tablet Take 1 tablet by mouth daily.     No facility-administered medications prior to visit.    No Known Allergies  ROS    See HPI Objective:    Physical Exam Vitals reviewed.  Constitutional:      General: She is not in acute distress.    Appearance: Normal appearance. She is obese. She is not ill-appearing.  HENT:     Head: Normocephalic and atraumatic.     Right Ear: Tympanic membrane normal.     Left Ear: Tympanic membrane normal.     Nose: Nose normal.     Mouth/Throat:     Mouth: Mucous membranes are moist.     Pharynx: Oropharynx is clear.  Eyes:     Extraocular Movements: Extraocular movements intact.     Conjunctiva/sclera: Conjunctivae normal.     Pupils: Pupils are equal, round, and reactive to light.  Neck:     Thyroid : No thyroid  mass or thyromegaly.     Vascular: No carotid bruit.  Cardiovascular:     Rate and Rhythm: Normal rate and regular rhythm.     Pulses: Normal pulses.     Heart sounds: Normal heart sounds.  Pulmonary:     Effort: Pulmonary effort is normal.     Breath sounds: Normal breath sounds.  Abdominal:     General: Abdomen is flat. Bowel sounds are normal. There is no distension.     Palpations: Abdomen is soft. There is no mass.     Tenderness: There is no abdominal tenderness. There is no right CVA tenderness, left CVA tenderness, guarding or rebound.  Genitourinary:    Comments: Deferred exam Musculoskeletal:        General: Normal range of motion.     Cervical back: Normal range of motion and neck supple. No tenderness.     Right lower leg: No edema.     Left lower leg: No edema.  Lymphadenopathy:     Cervical: No cervical adenopathy.  Skin:    General: Skin is warm and dry.     Capillary Refill: Capillary refill takes less than 2 seconds.  Neurological:     General: No focal deficit present.     Mental Status: She is alert  and oriented to person, place, and time. Mental status is at baseline.  Psychiatric:        Mood and Affect: Mood normal.        Behavior: Behavior normal.        Thought Content: Thought  content normal.        Judgment: Judgment normal.        There were no vitals taken for this visit. Wt Readings from Last 3 Encounters:  08/18/23 266 lb 12.8 oz (121 kg)  11/10/19 289 lb 6 oz (131.3 kg)  01/20/19 282 lb 9.6 oz (128.2 kg)       Assessment & Plan:   Problem List Items Addressed This Visit     Annual visit for general adult medical examination without abnormal findings - Primary    I am having Krista Merritt maintain her [place].  No orders of the defined types were placed in this encounter.

## 2023-09-21 NOTE — Assessment & Plan Note (Signed)
 Reffered to [facility].  Encouraged DASH diet, decrease po intake and increase exercise as tolerated. Needs 7-8 hours of sleep nightly. Avoid trans fats, eat small, frequent meals every 4-5 hours with lean proteins, complex carbs and healthy fats. Minimize simple carbs.

## 2023-09-22 ENCOUNTER — Ambulatory Visit (INDEPENDENT_AMBULATORY_CARE_PROVIDER_SITE_OTHER): Admitting: Student

## 2023-09-22 DIAGNOSIS — E66813 Obesity, class 3: Secondary | ICD-10-CM

## 2023-09-22 DIAGNOSIS — Z6841 Body Mass Index (BMI) 40.0 and over, adult: Secondary | ICD-10-CM

## 2023-09-22 DIAGNOSIS — Z0000 Encounter for general adult medical examination without abnormal findings: Secondary | ICD-10-CM

## 2023-09-22 DIAGNOSIS — R739 Hyperglycemia, unspecified: Secondary | ICD-10-CM

## 2023-09-27 ENCOUNTER — Encounter: Payer: Self-pay | Admitting: Obstetrics and Gynecology

## 2023-09-27 ENCOUNTER — Ambulatory Visit (INDEPENDENT_AMBULATORY_CARE_PROVIDER_SITE_OTHER): Payer: PRIVATE HEALTH INSURANCE | Admitting: Obstetrics and Gynecology

## 2023-09-27 VITALS — BP 118/78 | HR 81 | Ht 65.5 in | Wt 265.0 lb

## 2023-09-27 DIAGNOSIS — Z1331 Encounter for screening for depression: Secondary | ICD-10-CM

## 2023-09-27 DIAGNOSIS — N921 Excessive and frequent menstruation with irregular cycle: Secondary | ICD-10-CM | POA: Diagnosis not present

## 2023-09-27 DIAGNOSIS — Z23 Encounter for immunization: Secondary | ICD-10-CM

## 2023-09-27 DIAGNOSIS — Z8742 Personal history of other diseases of the female genital tract: Secondary | ICD-10-CM

## 2023-09-27 DIAGNOSIS — N6452 Nipple discharge: Secondary | ICD-10-CM

## 2023-09-27 DIAGNOSIS — Z7689 Persons encountering health services in other specified circumstances: Secondary | ICD-10-CM

## 2023-09-27 MED ORDER — INCASSIA 0.35 MG PO TABS: 1.0000 | ORAL_TABLET | Freq: Every day | ORAL | 6 refills | Status: AC

## 2023-09-27 MED ORDER — NORETHINDRONE 0.35 MG PO TABS: 1.0000 | ORAL_TABLET | Freq: Every day | ORAL | 11 refills | Status: AC

## 2023-09-27 NOTE — Progress Notes (Unsigned)
 33 y.o. y.o. female here for establish care. Was coming for annual but started cycle Patient's last menstrual period was 09/25/2023 (exact date). Period Cycle (Days): 28 Period Duration (Days): 5 Period Pattern: Regular Menstrual Flow: Heavy Menstrual Control: Maxi pad, Tampon Dysmenorrhea: None Can have 2 periods in a month two to three times a year Using 6 pads with tampons in a day for 5-6 days She is CD 3 today and it is heavy.  She has been out of her birth control for 2 weeks. Noticed it has reduced the amount she bleeds, but she does have some headaches on in it. She tried the nexplanon before and there was scar tissue and it was hard to remove She is afraid of the mirena IUD Pap: H/o LGSIL with colposcopy denies having a cervical LEEP or CKC Last pap: 6/24 Gardesil:  #1 given today. Counseled on the importance of the vaccination Denies any family history of gyn cancer or breast cancer Does report since after nipple rings were removed during her cycle she can get pain and can express pus from each side. Colonoscopy:none MMG: none   Body mass index is 43.43 kg/m.     09/27/2023    3:30 PM 08/18/2023    4:51 PM 11/10/2019   11:16 AM  Depression screen PHQ 2/9  Decreased Interest 0 0 0  Down, Depressed, Hopeless 0 0 0  PHQ - 2 Score 0 0 0  Altered sleeping  0   Tired, decreased energy  1   Change in appetite  1   Feeling bad or failure about yourself   0   Trouble concentrating  0   Moving slowly or fidgety/restless  0   Suicidal thoughts  0   PHQ-9 Score  2   Difficult doing work/chores  Not difficult at all     Blood pressure 118/78, pulse 81, height 5' 5.5 (1.664 m), weight 265 lb (120.2 kg), last menstrual period 09/25/2023, SpO2 99%.  No results found for: DIAGPAP, HPVHIGH, ADEQPAP  GYN HISTORY: No results found for: DIAGPAP, HPVHIGH, ADEQPAP  OB History  Gravida Para Term Preterm AB Living  1 1 1  0 0 1  SAB IAB Ectopic Multiple Live  Births  0 0 0 0 1    # Outcome Date GA Lbr Len/2nd Weight Sex Type Anes PTL Lv  1 Term 02/06/16 [redacted]w[redacted]d 09:38 / 00:20 8 lb 2.5 oz (3.7 kg) M Vag-Spont EPI  LIV    Past Medical History:  Diagnosis Date   Allergy    seasonal   Eczema 10/22/2014   Medical history non-contributory    Obesity 10/22/2014   Paresthesia 03/13/2016   Preventative health care 11/04/2014    Past Surgical History:  Procedure Laterality Date   COLPOSCOPY  2012   abnormal cells, cervix, roughly 2012, normal    Current Outpatient Medications on File Prior to Visit  Medication Sig Dispense Refill   INCASSIA  0.35 MG tablet Take 1 tablet by mouth daily.     No current facility-administered medications on file prior to visit.    Social History   Socioeconomic History   Marital status: Single    Spouse name: Not on file   Number of children: 1   Years of education: Not on file   Highest education level: Bachelor's degree (e.g., BA, AB, BS)  Occupational History   Occupation: Printmaker  Tobacco Use   Smoking status: Never   Smokeless tobacco: Never  Vaping Use   Vaping  status: Never Used  Substance and Sexual Activity   Alcohol use: Yes    Alcohol/week: 3.0 standard drinks of alcohol    Types: 3 Standard drinks or equivalent per week    Comment: occassionally    Drug use: Not Currently    Types: Marijuana    Comment: occassionally   Sexual activity: Yes    Partners: Male    Birth control/protection: None, Pill    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, preschool teacher  Other Topics Concern   Not on file  Social History Narrative   G1P1   Household: pt, her mother and her child    59 year old boy [place]--- as of 2025   Social Drivers of Corporate investment banker Strain: Not on file  Food Insecurity: No Food Insecurity (04/04/2020)   Received from [facility]   Hunger Vital Sign    Within the past 12 months, you worried that your food would run out before you got  the money to buy more.: Never true    Within the past 12 months, the food you bought just didn't last and you didn't have money to get more.: Never true  Transportation Needs: Not on file  Physical Activity: Not on file  Stress: Not on file  Social Connections: Unknown (07/22/2021)   Received [facility]   Social Network    Social Network: Not on file  Intimate Partner Violence: Unknown (06/18/2021)   Received from [facility]   HITS    Physically Hurt: Not on file    Insult or Talk Down To: Not on file    Threaten Physical Harm: Not on file    Scream or Curse: Not on file    Family History  Problem Relation Age of Onset   Hypertension Mother    Mental illness Father        suicide   Asthma Maternal Grandmother    COPD Paternal Grandmother        recurrent bronchitis   Arthritis Paternal Grandmother    Kidney failure Paternal Grandmother    Heart attack Paternal Grandmother    Dementia Paternal Grandfather    Colon cancer Neg Hx    Breast cancer Neg Hx      No Known Allergies    Patient's last menstrual period was Patient's last menstrual period was 09/25/2023 (exact date)..             Review of Systems Alls systems reviewed and are negative.         A:         Establish care, refill ocp's, DUB, menorrhagia                             P:        RTC for annual exam Schedule PUS to evaluate for gyn causes of menorrhagia, DUB Labs with PMD H/o LGSIL: RTC off cycle for pap smear. Counseled on the gardesil vaccination and  The importance. She agreed to the vaccination and her first shot was given today. Counseled on birth control options with continued progesterone pills vs. Mirena IUD.  She will consider her options. She would like a refill on her ocp's.  This was sent. Nipple discharge: referral for dx mmg and b/l breast US  sent. No follow-ups on file.  Almarie MARLA Carpen

## 2023-10-07 NOTE — Progress Notes (Signed)
 Opened in error

## 2023-10-12 ENCOUNTER — Ambulatory Visit (INDEPENDENT_AMBULATORY_CARE_PROVIDER_SITE_OTHER): Admitting: Student

## 2023-10-12 DIAGNOSIS — R739 Hyperglycemia, unspecified: Secondary | ICD-10-CM

## 2023-10-12 DIAGNOSIS — Z0000 Encounter for general adult medical examination without abnormal findings: Secondary | ICD-10-CM

## 2023-10-12 DIAGNOSIS — E785 Hyperlipidemia, unspecified: Secondary | ICD-10-CM

## 2023-10-12 NOTE — Progress Notes (Deleted)
 Subjective:     Patient ID: Krista Merritt, female    DOB: 12/12/1990, 33 y.o.   MRN: [id number]  No chief complaint on file.   HPI  Any questions or concerns today? (Skip to "sick visits" for OLDCART HPI)  Krista Merritt a 33 y.o.  female/female***presents today for a complete physical exam. Pt reports consuming a {diet types:17450} diet. {types:19826} Pt generally feels {DESC; WELL/FAIRLY WELL/POORLY:18703}. Reports sleeping {DESC; WELL/FAIRLY WELL/POORLY:18703}.  {does/does not:200015} have additional problems to discuss today.   New Surgeries or Family Hx: ***Yes/ Denies Habits: ETOH, illicit drugs, smoking, secondhand exposure, caffeine    Patient denies fever, chills, SOB, CP, palpitations, dyspnea, edema, HA, vision changes, N/V/D, abdominal pain, urinary symptoms, rash, weight changes, and recent illness or hospitalizations.     History of Present Illness              Health Maintenance Due  Topic Date Due   DTaP/Tdap/Td (6 - Tdap) 04/30/2000   Hepatitis B Vaccines (1 of 3 - 19+ 3-dose series) Never done   Cervical Cancer Screening (HPV/Pap Cotest)  05/01/2019   HPV VACCINES (2 - 3-dose SCDM series) 10/25/2023    Past Medical History:  Diagnosis Date   Allergy    seasonal   Eczema 10/22/2014   LGSIL on Pap smear of cervix    Medical history non-contributory    Obesity 10/22/2014   Paresthesia 03/13/2016   Preventative health care 11/04/2014    Past Surgical History:  Procedure Laterality Date   COLPOSCOPY  2012   abnormal cells, cervix, roughly 2012, normal    Family History  Problem Relation Age of Onset   Hypertension Mother    Mental illness Father        suicide   Asthma Maternal Grandmother    COPD Paternal Grandmother        recurrent bronchitis   Arthritis Paternal Grandmother    Kidney failure Paternal Grandmother    Heart attack Paternal Grandmother    Dementia Paternal Grandfather    Colon cancer Neg Hx    Breast cancer  Neg Hx     Social History   Socioeconomic History   Marital status: Single    Spouse name: Not on file   Number of children: 1   Years of education: Not on file   Highest education level: Bachelor's degree (e.g., BA, AB, BS)  Occupational History   Occupation: Printmaker  Tobacco Use   Smoking status: Never   Smokeless tobacco: Never  Vaping Use   Vaping status: Never Used  Substance and Sexual Activity   Alcohol use: Yes    Alcohol/week: 3.0 standard drinks of alcohol    Types: 3 Standard drinks or equivalent per week    Comment: occassionally    Drug use: Not Currently    Types: Marijuana    Comment: occassionally   Sexual activity: Yes    Partners: Male    Birth control/protection: None, Pill    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, preschool teacher  Other Topics Concern   Not on file  Social History Narrative   G1P1   Household: pt, her mother and her child    50 year old boy [place]--- as of 2025   Social Drivers of Corporate investment banker Strain: Not on file  Food Insecurity: No Food Insecurity (04/04/2020)   Received [facility]   Hunger Vital Sign    Within the past 12 months, you worried that  your food would run out before you got the money to buy more.: Never true    Within the past 12 months, the food you bought just didn't last and you didn't have money to get more.: Never true  Transportation Needs: Not on file  Physical Activity: Not on file  Stress: Not on file  Social Connections: Unknown (07/22/2021)   Received from [facility]   Social Network    Social Network: Not on file  Intimate Partner Violence: Unknown (06/18/2021)   Received from [facility]   HITS    Physically Hurt: Not on file    Insult or Talk Down To: Not on file    Threaten Physical Harm: Not on file    Scream or Curse: Not on file    Outpatient Medications Prior to Visit  Medication Sig Dispense Refill   INCASSIA  0.35 MG tablet Take 1 tablet  (0.35 mg total) by mouth daily. 84 tablet 6   norethindrone  (INCASSIA ) 0.35 MG tablet Take 1 tablet (0.35 mg total) by mouth daily. 28 tablet 11   No facility-administered medications prior to visit.    No Known Allergies  ROS See HPI    Objective:    Physical Exam Constitutional:      General: She is not in acute distress.    Appearance: She is not ill-appearing, toxic-appearing or diaphoretic.  HENT:     Head: Normocephalic and atraumatic.     Right Ear: Tympanic membrane, ear canal and external ear normal.     Left Ear: Tympanic membrane, ear canal and external ear normal.     Nose: Nose normal. No congestion.     Mouth/Throat:     Mouth: Mucous membranes are moist.     Pharynx: Oropharynx is clear.  Eyes:     Extraocular Movements: Extraocular movements intact.     Right eye: Normal extraocular motion.     Left eye: Normal extraocular motion.     Conjunctiva/sclera: Conjunctivae normal.     Pupils: Pupils are equal, round, and reactive to light.  Neck:     Thyroid : No thyroid  mass or thyromegaly.     Vascular: No carotid bruit or JVD.  Cardiovascular:     Rate and Rhythm: Normal rate and regular rhythm.     Pulses: Normal pulses.          Radial pulses are 2+ on the right side and 2+ on the left side.       Dorsalis pedis pulses are 2+ on the right side and 2+ on the left side.     Heart sounds: Normal heart sounds, S1 normal and S2 normal. No murmur heard.    No friction rub. No Merritt.  Pulmonary:     Effort: Pulmonary effort is normal. No respiratory distress.     Breath sounds: Normal breath sounds.  Abdominal:     General: Bowel sounds are normal. There is no distension.     Palpations: Abdomen is soft.     Tenderness: There is no abdominal tenderness. There is no guarding.  Musculoskeletal:        General: Normal range of motion.     Cervical back: Full passive range of motion without pain and normal range of motion. No edema or erythema.     Right lower  leg: No edema.     Left lower leg: No edema.  Lymphadenopathy:     Cervical: No cervical adenopathy.  Skin:    General: Skin is warm and dry.  Capillary Refill: Capillary refill takes less than 2 seconds.  Neurological:     General: No focal deficit present.     Mental Status: She is alert and oriented to person, place, and time.     Cranial Nerves: No cranial nerve deficit.     Motor: No weakness.     Coordination: Coordination normal.     Gait: Gait normal.     Deep Tendon Reflexes: Reflexes normal.  Psychiatric:        Mood and Affect: Mood normal.        Behavior: Behavior normal.        Thought Content: Thought content normal.      LMP 09/25/2023 (Exact Date)  Wt Readings from Last 3 Encounters:  09/27/23 265 lb (120.2 kg)  08/18/23 266 lb 12.8 oz (121 kg)  11/10/19 289 lb 6 oz (131.3 kg)       Assessment & Plan:   Problem List Items Addressed This Visit     Annual visit for general adult medical examination without abnormal findings - Primary   Other Visit Diagnoses       Hyperlipidemia, unspecified hyperlipidemia type         Hyperglycemia           I am having Krista Merritt maintain her norethindrone  and Incassia .  No orders of the defined types were placed in this encounter.

## 2023-10-13 ENCOUNTER — Encounter: Payer: Self-pay | Admitting: Student

## 2023-10-13 NOTE — Progress Notes (Signed)
 Pt no show- chart error.

## 2023-10-18 ENCOUNTER — Encounter: Payer: PRIVATE HEALTH INSURANCE | Admitting: Radiology

## 2023-11-29 ENCOUNTER — Ambulatory Visit: Admitting: Obstetrics and Gynecology

## 2024-01-25 ENCOUNTER — Ambulatory Visit: Admitting: Obstetrics and Gynecology
